# Patient Record
Sex: Male | Born: 1935 | Race: White | Hispanic: No | Smoking: Former smoker
Health system: Southern US, Community
[De-identification: ages and names within clinical notes are randomized; demographics above are authoritative.]

## PROBLEM LIST (undated history)

## (undated) DIAGNOSIS — J449 Chronic obstructive pulmonary disease, unspecified: Secondary | ICD-10-CM

## (undated) DIAGNOSIS — E538 Deficiency of other specified B group vitamins: Secondary | ICD-10-CM

## (undated) DIAGNOSIS — E039 Hypothyroidism, unspecified: Secondary | ICD-10-CM

## (undated) DIAGNOSIS — N189 Chronic kidney disease, unspecified: Secondary | ICD-10-CM

## (undated) DIAGNOSIS — G309 Alzheimer's disease, unspecified: Secondary | ICD-10-CM

## (undated) DIAGNOSIS — E559 Vitamin D deficiency, unspecified: Secondary | ICD-10-CM

## (undated) DIAGNOSIS — D649 Anemia, unspecified: Secondary | ICD-10-CM

## (undated) DIAGNOSIS — F0280 Dementia in other diseases classified elsewhere without behavioral disturbance: Secondary | ICD-10-CM

---

## 2008-03-26 ENCOUNTER — Ambulatory Visit: Payer: Self-pay | Admitting: Internal Medicine

## 2008-04-01 ENCOUNTER — Ambulatory Visit: Payer: Self-pay | Admitting: Family Medicine

## 2008-06-12 ENCOUNTER — Other Ambulatory Visit: Payer: Self-pay

## 2008-06-12 ENCOUNTER — Emergency Department: Payer: Self-pay | Admitting: Unknown Physician Specialty

## 2008-06-19 ENCOUNTER — Ambulatory Visit: Payer: Self-pay | Admitting: Specialist

## 2008-07-24 ENCOUNTER — Ambulatory Visit: Payer: Self-pay | Admitting: Unknown Physician Specialty

## 2012-03-10 ENCOUNTER — Ambulatory Visit: Payer: Self-pay | Admitting: Internal Medicine

## 2012-10-10 ENCOUNTER — Ambulatory Visit: Payer: Self-pay | Admitting: Ophthalmology

## 2012-10-10 LAB — HEMOGLOBIN: HGB: 13.3 g/dL (ref 13.0–18.0)

## 2012-10-16 ENCOUNTER — Ambulatory Visit: Payer: Self-pay | Admitting: Ophthalmology

## 2012-12-25 ENCOUNTER — Ambulatory Visit: Payer: Self-pay | Admitting: Ophthalmology

## 2013-05-08 ENCOUNTER — Ambulatory Visit: Payer: Self-pay | Admitting: Ophthalmology

## 2013-05-14 ENCOUNTER — Ambulatory Visit: Payer: Self-pay | Admitting: Ophthalmology

## 2013-08-01 ENCOUNTER — Ambulatory Visit: Payer: Self-pay | Admitting: Neurology

## 2015-03-04 NOTE — Op Note (Signed)
PATIENT NAME:  Adrian Barber, Abir R MR#:  [id number] DATE OF BIRTH:  1932-10-08  DATE OF PROCEDURE:  10/16/2012  PREOPERATIVE DIAGNOSIS:  Cataract, left eye.    POSTOPERATIVE DIAGNOSIS:  Cataract, left eye.  PROCEDURE PERFORMED:  Extracapsular cataract extraction using phacoemulsification with placement of an Alcon SN6CWS, 19.0-diopter posterior chamber lens, serial [id number].  SURGEON:  Maylon PeppersSteven A. Kewana Sanon, MD  ASSISTANT:  None.  ANESTHESIA:  4% lidocaine and 0.75% Marcaine in a 50/50 mixture with 10 units/mL of Hylenex added, given as a peribulbar.  ANESTHESIOLOGIST:  Randall AnGjibertus Van Staveren, MD  COMPLICATIONS:  None.  ESTIMATED BLOOD LOSS:  Less than 1 ml.  DESCRIPTION OF PROCEDURE:  The patient was brought to the operating room and given a peribulbar block.  The patient was then prepped and draped in the usual fashion.  The vertical rectus muscles were imbricated using 5-0 silk sutures.  These sutures were then clamped to the sterile drapes as bridle sutures.  A limbal peritomy was performed extending two clock hours and hemostasis was obtained with cautery.  A partial thickness scleral groove was made at the surgical limbus and dissected anteriorly in a lamellar dissection using an Alcon crescent knife.  The anterior chamber was entered supero-temporally with a Superblade and through the lamellar dissection with a 2.6 mm keratome.  DisCoVisc was used to replace the aqueous and a continuous tear capsulorrhexis was carried out.  Hydrodissection and hydrodelineation were carried out with balanced salt and a 27 gauge canula.  The nucleus was rotated to confirm the effectiveness of the hydrodissection.  Phacoemulsification was carried out using a divide-and-conquer technique.  Total ultrasound time was 3 minutes and 32 seconds with an average power of 25.4 percent and CDE 72.52.  Irrigation/aspiration was used to remove the residual cortex.  DisCoVisc was used to inflate the capsule and  the internal incision was enlarged to 3 mm with the crescent knife.  The intraocular lens was folded and inserted into the capsular bag using the AcrySert delivery system.  Irrigation/aspiration was used to remove the residual DisCoVisc.  Miostat was injected into the anterior chamber through the paracentesis track to inflate the anterior chamber and induce miosis.  The wound was checked for leaks and none were found. The conjunctiva was closed with cautery and the bridle sutures were removed.  Two drops of 0.3% Vigamox were placed on the eye.   An eye shield was placed on the eye.  The patient was discharged to the recovery room in good condition.  ____________________________ Maylon PeppersSteven A. Chaddrick Brue, MD sad:slb D: 10/16/2012 13:07:04 ET T: 10/16/2012 13:13:27 ET JOB#: [id number]  cc: Viviann SpareSteven A. Ivet Guerrieri, MD, <Dictator> Erline LevineSTEVEN A Erwin Nishiyama MD ELECTRONICALLY SIGNED 10/23/2012 12:53

## 2015-03-07 NOTE — Op Note (Signed)
PATIENT NAME:  Adrian Barber, Amedeo R MR#:  [id number] DATE OF BIRTH:  16-Dec-1931  DATE OF PROCEDURE:  12/25/2012  PREOPERATIVE DIAGNOSIS:  Cataract, right eye.   POSTOPERATIVE DIAGNOSIS:  Cataract, right eye.  PROCEDURE PERFORMED:  Extracapsular cataract extraction using phacoemulsification with placement of an Alcon SN6CWS, 19.8-diopter posterior chamber lens, serial [id number].  SURGEON:  Maylon PeppersSteven A. Machaela Caterino, MD  ASSISTANT:  None.  ANESTHESIA:  4% lidocaine and 0.75% Marcaine in a 50/50 mixture with 10 units/mL of Hylenex added, given as a peribulbar.   ANESTHESIOLOGIST:  Dr. Dimple Caseyice  COMPLICATIONS:  None.  ESTIMATED BLOOD LOSS:  Less than 1 ml.  DESCRIPTION OF PROCEDURE:  The patient was brought to the operating room and given a peribulbar block.  The patient was then prepped and draped in the usual fashion.  The vertical rectus muscles were imbricated using 5-0 silk sutures.  These sutures were then clamped to the sterile drapes as bridle sutures.  A limbal peritomy was performed extending two clock hours and hemostasis was obtained with cautery.  A partial thickness scleral groove was made at the surgical limbus and dissected anteriorly in a lamellar dissection using an Alcon crescent knife.  The anterior chamber was entered superonasally with a Superblade and through the lamellar dissection with a 2.6 mm keratome.  DisCoVisc was used to replace the aqueous and a continuous tear capsulorrhexis was carried out.  Hydrodissection and hydrodelineation were carried out with balanced salt and a 27 gauge canula.  The nucleus was rotated to confirm the effectiveness of the hydrodissection.  Phacoemulsification was carried out using a divide-and-conquer technique.  Total ultrasound time was 2 minutes and 23 seconds with an average power of 28.2 percent and CDE 57.16.  Irrigation/aspiration was used to remove the residual cortex.  DisCoVisc was used to inflate the capsule and the internal incision  was enlarged to 3 mm with the crescent knife.  The intraocular lens was folded and inserted into the capsular bag using the AcrySert delivery system.  Irrigation/aspiration was used to remove the residual DisCoVisc.  Miostat was injected into the anterior chamber through the paracentesis track to inflate the anterior chamber and induce miosis. A tenth of a milliliter of cefuroxime was placed in the anterior chamber via the paracentesis tract. The wound was checked for leaks and none were found. The conjunctiva was closed with cautery and the bridle sutures were removed.  Two drops of 0.3% Vigamox were placed on the eye.   An eye shield was placed on the eye.  The patient was discharged to the recovery room in good condition. ___________________________ Maylon PeppersSteven A. Kayna Suppa, MD sad:sb D: 12/25/2012 13:09:31 ET T: 12/25/2012 13:21:38 ET JOB#: [id number]  cc: Viviann SpareSteven A. Deric Bocock, MD, <Dictator> Erline LevineSTEVEN A Daphyne Miguez MD ELECTRONICALLY SIGNED 01/15/2013 13:34

## 2015-03-07 NOTE — Op Note (Signed)
PATIENT NAME:  Adrian Barber, Adrian Barber MR#:  [id number] DATE OF BIRTH:  1932-09-21  DATE OF PROCEDURE:  05/14/2013  PREOPERATIVE DIAGNOSIS: Retrained fragment of cataract, right eye.   POSTOPERATIVE DIAGNOSIS: Retained fragment cataract, right eye.   PROCEDURE PERFORMED: Removal of retained fragment, right eye.   ANESTHESIA: Lidocaine 0.04% and 0.75% Marcaine in a 50-50 mixture with 10 units/mL of Hylenex added, given as a peribulbar.   ANESTHESIOLOGIST: Dr. Noralyn Pickarroll.   COMPLICATIONS: None.   ESTIMATED BLOOD LOSS: Less than 1 mL.   DESCRIPTION OF PROCEDURE: The patient was brought to the operating room and given IV sedation. He received a peribulbar block and then was prepped and draped in the usual fashion. The vertical rectus muscles were imbricated using 5-0 silk sutures and bridle sutures. The incision was cracked open using a Barraquer. This was done after a paracentesis incision had been made superonasally. A blunt crescent knife was used to dissect through the prior wound and into the anterior chamber. Eye handpiece was inserted into the anterior chamber and the loose fragment came to the tip immediately. Barraquer sweep was used to break up the piece and get it small enough to suck into the port of the eye handpiece. The eye handpiece was removed and the wound was inflated with balanced salt. The wound was felt a little unstable, so a single 10-0 nylon suture was placed across the wound. Balanced salt was placed in the anterior chamber to deepen the chamber and 1/10 of mL of cefuroxime was injected through the paracentesis track. The knot was rotated and buried superiorly. The wound was checked for leaks. None were found. The bridle sutures were removed. Two drops of Vigamox were placed in the eye. A shield was placed on the eye and the patient was discharged to the recovery room in good condition.   ____________________________ Maylon PeppersSteven A. Tamaka Sawin, MD sad:aw D: 05/14/2013 13:56:15  ET T: 05/14/2013 14:03:52 ET JOB#: [id number]  cc: Viviann SpareSteven A. Nautia Lem, MD, <Dictator> Erline LevineSTEVEN A Darilyn Storbeck MD ELECTRONICALLY SIGNED 05/21/2013 13:24

## 2017-10-18 ENCOUNTER — Other Ambulatory Visit: Payer: Self-pay | Admitting: Internal Medicine

## 2017-10-18 DIAGNOSIS — N183 Chronic kidney disease, stage 3 unspecified: Secondary | ICD-10-CM

## 2017-10-25 ENCOUNTER — Other Ambulatory Visit: Payer: Self-pay

## 2017-10-25 ENCOUNTER — Ambulatory Visit
Admission: RE | Admit: 2017-10-25 | Discharge: 2017-10-25 | Disposition: A | Discharge status: Home / Self Care | Payer: Medicare Other | Source: Ambulatory Visit | Attending: Internal Medicine | Admitting: Internal Medicine

## 2017-10-31 ENCOUNTER — Ambulatory Visit
Admission: RE | Admit: 2017-10-31 | Discharge: 2017-10-31 | Disposition: A | Discharge status: Home / Self Care | Payer: Medicare Other | Source: Ambulatory Visit | Attending: Internal Medicine | Admitting: Internal Medicine

## 2017-10-31 DIAGNOSIS — N183 Chronic kidney disease, stage 3 unspecified: Secondary | ICD-10-CM

## 2018-06-14 ENCOUNTER — Emergency Department: Payer: Medicare Other

## 2018-06-14 ENCOUNTER — Other Ambulatory Visit: Payer: Self-pay

## 2018-06-14 ENCOUNTER — Emergency Department
Admission: EM | Admit: 2018-06-14 | Discharge: 2018-06-15 | Disposition: A | Discharge status: Home / Self Care | Payer: Medicare Other | Source: Home / Self Care | Attending: Emergency Medicine | Admitting: Emergency Medicine

## 2018-06-14 DIAGNOSIS — S72001A Fracture of unspecified part of neck of right femur, initial encounter for closed fracture: Secondary | ICD-10-CM | POA: Diagnosis not present

## 2018-06-14 DIAGNOSIS — N189 Chronic kidney disease, unspecified: Secondary | ICD-10-CM | POA: Insufficient documentation

## 2018-06-14 DIAGNOSIS — W1830XA Fall on same level, unspecified, initial encounter: Secondary | ICD-10-CM | POA: Insufficient documentation

## 2018-06-14 DIAGNOSIS — G309 Alzheimer's disease, unspecified: Secondary | ICD-10-CM

## 2018-06-14 DIAGNOSIS — W19XXXA Unspecified fall, initial encounter: Secondary | ICD-10-CM

## 2018-06-14 DIAGNOSIS — Y939 Activity, unspecified: Secondary | ICD-10-CM | POA: Insufficient documentation

## 2018-06-14 DIAGNOSIS — E039 Hypothyroidism, unspecified: Secondary | ICD-10-CM | POA: Insufficient documentation

## 2018-06-14 DIAGNOSIS — F0280 Dementia in other diseases classified elsewhere without behavioral disturbance: Secondary | ICD-10-CM

## 2018-06-14 DIAGNOSIS — S93401A Sprain of unspecified ligament of right ankle, initial encounter: Secondary | ICD-10-CM | POA: Insufficient documentation

## 2018-06-14 DIAGNOSIS — Y92129 Unspecified place in nursing home as the place of occurrence of the external cause: Secondary | ICD-10-CM | POA: Insufficient documentation

## 2018-06-14 DIAGNOSIS — J449 Chronic obstructive pulmonary disease, unspecified: Secondary | ICD-10-CM | POA: Insufficient documentation

## 2018-06-14 DIAGNOSIS — S72141A Displaced intertrochanteric fracture of right femur, initial encounter for closed fracture: Secondary | ICD-10-CM | POA: Diagnosis not present

## 2018-06-14 DIAGNOSIS — Y999 Unspecified external cause status: Secondary | ICD-10-CM | POA: Insufficient documentation

## 2018-06-14 DIAGNOSIS — M79671 Pain in right foot: Secondary | ICD-10-CM

## 2018-06-14 HISTORY — DX: Dementia in other diseases classified elsewhere, unspecified severity, without behavioral disturbance, psychotic disturbance, mood disturbance, and anxiety: F02.80

## 2018-06-14 HISTORY — DX: Deficiency of other specified B group vitamins: E53.8

## 2018-06-14 HISTORY — DX: Chronic kidney disease, unspecified: N18.9

## 2018-06-14 HISTORY — DX: Alzheimer's disease, unspecified: G30.9

## 2018-06-14 HISTORY — DX: Vitamin D deficiency, unspecified: E55.9

## 2018-06-14 HISTORY — DX: Anemia, unspecified: D64.9

## 2018-06-14 HISTORY — DX: Chronic obstructive pulmonary disease, unspecified: J44.9

## 2018-06-14 HISTORY — DX: Hypothyroidism, unspecified: E03.9

## 2018-06-14 NOTE — ED Provider Notes (Addendum)
[facility] Emergency Department Provider Note  ____________________________________________   First MD Initiated Contact with Patient 06/14/18 2312     (approximate)  I have reviewed the triage vital signs and the nursing notes.   HISTORY  Chief Complaint Fall  Level 5 caveat:  history/ROS limited by chronic dementia  HPI Adrian Barber is a 84 y.o. male with medical history as listed below which notably includes dementia who presents by EMS from his nursing facility for evaluation after a fall.  He reportedly was found sitting on the floor.  It was reported to EMS as the patient having ankle pain but the patient denied having any pain upon arrival.  EMS reports that they were told that the bilateral lower extremity swelling is normal and baseline for him.  The patient is awake and alert although not oriented due to the dementia.  He denies any pain.  He denies shortness of breath.  He does not remember what happened.  He says that his ankle does not hurt.  Additional details such as the acuity, time of onset, severity, etc., are not obtainable secondary to his chronic dementia.  Past Medical History:  Diagnosis Date  . Alzheimer's dementia   . Anemia   . Chronic kidney disease   . COPD (chronic obstructive pulmonary disease) (HCC)   . Hypothyroidism   . Vitamin B12 deficiency   . Vitamin D deficiency     There are no active problems to display for this patient.   History reviewed. No pertinent surgical history.  Prior to Admission medications   Not on File    Allergies Patient has no known allergies.  History reviewed. No pertinent family history.  Social History Social History   Tobacco Use  . Smoking status: Unknown If Ever Smoked  Substance Use Topics  . Alcohol use: Not Currently  . Drug use: Not on file    Review of Systems Level 5 caveat:  history/ROS limited by acute/critical illness.  The patient denies any pain at this  time including right ankle pain, headache, shortness of breath, neck pain, chest pain, abdominal pain. ____________________________________________   PHYSICAL EXAM:  VITAL SIGNS: ED Triage Vitals  Enc Vitals Group     BP 06/14/18 2223 (!) 170/87     Pulse Rate 06/14/18 2223 74     Resp 06/14/18 2223 16     Temp 06/14/18 2223 98.2 F (36.8 C)     Temp Source 06/14/18 2223 Oral     SpO2 06/14/18 2223 97 %     Weight 06/14/18 2221 77.1 kg (170 lb)     Height 06/14/18 2221 1.753 m (5\' 9" )     Head Circumference --      Peak Flow --      Pain Score 06/14/18 2221 0     Pain Loc --      Pain Edu? --      Excl. in GC? --     Constitutional: Awake and alert, conversant and answers questions, but not able to provide any history. Eyes: Conjunctivae are normal.  Head: Atraumatic. Nose: No congestion/rhinnorhea. Mouth/Throat: Mucous membranes are moist. Neck: No stridor.  No meningeal signs.  No cervical spine tenderness to palpation. Cardiovascular: Normal rate, regular rhythm. Good peripheral circulation. Grossly normal heart sounds. Respiratory: Normal respiratory effort.  No retractions. Lungs CTAB. Gastrointestinal: Soft and nontender. No distention.  Musculoskeletal: 1+ pitting edema bilateral lower extremities at baseline.  I fully range the patient's right ankle which  was originally reported as being painful and he had no pain or reproducible tenderness to palpation.  He is able to plantarflex and dorsiflex the foot and ankle without any pain or tenderness.  However, he reports pain/tenderness with weightbearing but only on the right foot. Neurologic:  Normal speech and language. No gross focal neurologic deficits are appreciated.  Skin:  Skin is warm, dry and intact. No rash noted.   ____________________________________________   LABS (all labs ordered are listed, but only abnormal results are displayed)  Labs Reviewed - No data to  display ____________________________________________  EKG  None - EKG not ordered by ED physician ____________________________________________  RADIOLOGY I, Loleta Rose, personally viewed and evaluated these images (plain radiographs) as part of my medical decision making, as well as reviewing the written report by the radiologist.  ED MD interpretation: No abnormal findings on radiograph other than some soft tissue swelling  Official radiology report(s): Dg Ankle Complete Right  Result Date: 06/14/2018 CLINICAL DATA:  Ankle swelling possible fall EXAM: RIGHT ANKLE - COMPLETE 3+ VIEW COMPARISON:  None. FINDINGS: Diffuse soft tissue swelling. No fracture or malalignment. Vascular calcifications. Ankle mortise is symmetric IMPRESSION: Soft tissue swelling.  No acute osseous abnormality. Electronically Signed   By: Jasmine Pang M.D.   On: 06/14/2018 22:52   Dg Foot Complete Right  Result Date: 06/15/2018 CLINICAL DATA:  Possible fall. Right foot and ankle pain. Pain most notable proximal to the great toe metatarsal phalangeal joint. EXAM: RIGHT FOOT COMPLETE - 3+ VIEW COMPARISON:  Ankle radiograph 1 our prior. FINDINGS: No acute fracture. Hammertoe deformity of the second through fifth digit partially limits assessment. 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 No focal soft tissue abnormality. IMPRESSION: No fracture or dislocation of the right foot. Electronically Signed   By: Rubye Oaks M.D.   On: 06/15/2018 01:20    ____________________________________________   PROCEDURES  Critical Care performed: No   Procedure(s) performed:   Procedures   ____________________________________________   INITIAL IMPRESSION / ASSESSMENT AND PLAN / ED COURSE  As part of my medical decision making, I reviewed the following data within the electronic MEDICAL RECORD NUMBER Nursing notes reviewed and incorporated and Radiograph reviewed     The patient is in no acute distress with  no evidence of any traumatic injuries.  He has no pain or tenderness to the right ankle or any other part of his body.  He is not having any shortness of breath.  He denies headache, neck pain, chest pain, abdominal pain, and any pain in his arms or legs.  His radiograph shows no evidence of any acute injuries to the ankle.   Clinical Course as of Jun 15 152  Thu Jun 15, 2018  0141 No evidence of foot fracture.  Will discharge as per previous plan.  DG Foot Complete Right [CF]    Clinical Course User Index [CF] Loleta Rose, MD    When Butch, his ED nurse, tried to help him with ambulation, he reported pain with weightbearing on the right foot.  Again, fortunately there is no evidence of any fracture or dislocation, but I believe he most likely has sprained his right ankle.  He is in the memory care unit and thus has access to assistance and I have no indication to admit him to the hospital based on this injury.  But called and spoke with the facility and they reported that the patient is generally independent but does have difficulty dressing himself and typically  walks "like he is drunk", placing more weight on one foot than the other but occasionally alternates which foot he favors.    We will place an Ace wrap and I will provide information about weightbearing as tolerated and follow-up with orthopedics, but at this point there is no further treatment indicated and no indication for admission to the hospital.  ____________________________________________  FINAL CLINICAL IMPRESSION(S) / ED DIAGNOSES  Final diagnoses:  Fall, initial encounter  Sprain of right ankle, unspecified ligament, initial encounter  Right foot pain     MEDICATIONS GIVEN DURING THIS VISIT:  Medications - No data to display   ED Discharge Orders    None       Note:  This document was prepared using Dragon voice recognition software and may include unintentional dictation errors.     Loleta RoseForbach, Aedyn Mckeon,  MD 06/15/18 [phone]

## 2018-06-14 NOTE — ED Triage Notes (Signed)
Pt arrives from [facility] via [facility] for a possible fall. Pt was found sitting on the floor by staff. Reported to EMS R ankle pain. Denies pain upon arrival. EMS reports dementia baseline. EMS reports bialt ankle swelling is normal. VSS enroute.

## 2018-06-14 NOTE — Discharge Instructions (Addendum)
As we discussed, you do not have any broken or dislocated bones in your foot or ankle, but most likely the pain is coming from an ankle sprain.  Please read through the included information about routine injury care (RICE = rest, ice, compression, elevation), and take over-the-counter pain medicine according to label instructions.  Use over-the-counter ibuprofen and/or tolerated as approved by your primary physician.  Follow-up is recommended with the orthopedic surgeon or with your regular doctor.

## 2018-06-15 ENCOUNTER — Emergency Department: Payer: Medicare Other

## 2018-06-15 NOTE — ED Notes (Signed)
Spoke with Fleet Contrasachel ([facility] about pt's baseline per Dr York CeriseForbach. She reports that the pt is completely independent with all ADL's at their facility except getting dressed. Pt does not normally use a cane or walker for ambulation, but (per Fleet Contrasachel) pt does "walk around like he's drunk normally, and places more weight on one foot than the other" which she relates might be responsible for his pain d/t overuse.

## 2018-06-15 NOTE — ED Notes (Signed)
Unable to obtain signature d/t pt's condition with dementia.

## 2018-06-16 ENCOUNTER — Emergency Department: Payer: Medicare Other

## 2018-06-16 ENCOUNTER — Emergency Department
Admission: EM | Admit: 2018-06-16 | Discharge: 2018-06-16 | Disposition: A | Discharge status: Home / Self Care | Payer: Medicare Other | Source: Home / Self Care | Attending: Emergency Medicine | Admitting: Emergency Medicine

## 2018-06-16 ENCOUNTER — Other Ambulatory Visit: Payer: Self-pay

## 2018-06-16 DIAGNOSIS — F0280 Dementia in other diseases classified elsewhere without behavioral disturbance: Secondary | ICD-10-CM

## 2018-06-16 DIAGNOSIS — J449 Chronic obstructive pulmonary disease, unspecified: Secondary | ICD-10-CM

## 2018-06-16 DIAGNOSIS — T887XXA Unspecified adverse effect of drug or medicament, initial encounter: Secondary | ICD-10-CM

## 2018-06-16 DIAGNOSIS — Y658 Other specified misadventures during surgical and medical care: Secondary | ICD-10-CM

## 2018-06-16 DIAGNOSIS — E039 Hypothyroidism, unspecified: Secondary | ICD-10-CM

## 2018-06-16 DIAGNOSIS — Z79899 Other long term (current) drug therapy: Secondary | ICD-10-CM | POA: Insufficient documentation

## 2018-06-16 DIAGNOSIS — J189 Pneumonia, unspecified organism: Secondary | ICD-10-CM

## 2018-06-16 DIAGNOSIS — G309 Alzheimer's disease, unspecified: Secondary | ICD-10-CM | POA: Insufficient documentation

## 2018-06-16 DIAGNOSIS — N189 Chronic kidney disease, unspecified: Secondary | ICD-10-CM

## 2018-06-16 DIAGNOSIS — Z7982 Long term (current) use of aspirin: Secondary | ICD-10-CM | POA: Insufficient documentation

## 2018-06-16 DIAGNOSIS — R4182 Altered mental status, unspecified: Secondary | ICD-10-CM

## 2018-06-16 LAB — COMPREHENSIVE METABOLIC PANEL
ALK PHOS: 66 U/L (ref 38–126)
ALT: 16 U/L (ref 0–44)
AST: 35 U/L (ref 15–41)
Albumin: 3.6 g/dL (ref 3.5–5.0)
Anion gap: 10 (ref 5–15)
BILIRUBIN TOTAL: 0.8 mg/dL (ref 0.3–1.2)
BUN: 30 mg/dL — AB (ref 8–23)
CALCIUM: 8.8 mg/dL — AB (ref 8.9–10.3)
CO2: 26 mmol/L (ref 22–32)
Chloride: 105 mmol/L (ref 98–111)
Creatinine, Ser: 1.53 mg/dL — ABNORMAL HIGH (ref 0.61–1.24)
GFR calc Af Amer: 46 mL/min — ABNORMAL LOW (ref >60)
GFR calc non Af Amer: 40 mL/min — ABNORMAL LOW (ref >60)
GLUCOSE: 133 mg/dL — AB (ref 70–99)
Potassium: 4.3 mmol/L (ref 3.5–5.1)
Sodium: 141 mmol/L (ref 135–145)
Total Protein: 7.6 g/dL (ref 6.5–8.1)

## 2018-06-16 LAB — URINALYSIS, COMPLETE (UACMP) WITH MICROSCOPIC
BACTERIA UA: NONE SEEN
BILIRUBIN URINE: NEGATIVE
Glucose, UA: NEGATIVE mg/dL
KETONES UR: NEGATIVE mg/dL
LEUKOCYTES UA: NEGATIVE
NITRITE: NEGATIVE
Protein, ur: NEGATIVE mg/dL
Specific Gravity, Urine: 1.009 (ref 1.005–1.030)
pH: 5 (ref 5.0–8.0)

## 2018-06-16 LAB — CBC WITH DIFFERENTIAL/PLATELET
Basophils Absolute: 0.1 10*3/uL (ref 0–0.1)
Basophils Relative: 0 %
EOS PCT: 0 %
Eosinophils Absolute: 0 10*3/uL (ref 0–0.7)
HCT: 36.3 % — ABNORMAL LOW (ref 40.0–52.0)
Hemoglobin: 12.4 g/dL — ABNORMAL LOW (ref 13.0–18.0)
Lymphocytes Relative: 11 %
Lymphs Abs: 1.9 10*3/uL (ref 1.0–3.6)
MCH: 30.5 pg (ref 26.0–34.0)
MCHC: 34 g/dL (ref 32.0–36.0)
MCV: 89.5 fL (ref 80.0–100.0)
MONO ABS: 1.6 10*3/uL — AB (ref 0.2–1.0)
Monocytes Relative: 10 %
Neutro Abs: 13.6 10*3/uL — ABNORMAL HIGH (ref 1.4–6.5)
Neutrophils Relative %: 79 %
PLATELETS: 199 10*3/uL (ref 150–440)
RBC: 4.06 MIL/uL — ABNORMAL LOW (ref 4.40–5.90)
RDW: 14 % (ref 11.5–14.5)
WBC: 17.3 10*3/uL — ABNORMAL HIGH (ref 3.8–10.6)

## 2018-06-16 LAB — TROPONIN I

## 2018-06-16 MED ORDER — AZITHROMYCIN 250 MG PO TABS: ORAL_TABLET | ORAL | 0 refills | Status: DC

## 2018-06-16 NOTE — ED Notes (Signed)
Patient transported to CT 

## 2018-06-16 NOTE — ED Notes (Signed)
ED Provider at bedside. 

## 2018-06-16 NOTE — ED Notes (Signed)
Esignature pad not available at this time. Pt family aware of discharge instructions and verablized understanding. [facility] aware of pt and new status.

## 2018-06-16 NOTE — ED Triage Notes (Addendum)
[facility]. Per EMS, staff state patient has been sleeping all day and nonambulatory. Pt received Remeron last night for first time. Pt sitting with head down, eyes shut and does not respond verbally.  CBG 104 with EMS. VSS with EMS.

## 2018-06-16 NOTE — ED Provider Notes (Signed)
[facility] Emergency Department Provider Note ____________________________________________   First MD Initiated Contact with Patient 06/16/18 1245     (approximate)  I have reviewed the triage vital signs and the nursing notes.   HISTORY  Chief Complaint Drowsiness  Level 5 caveat: History of present illness limited due to dementia  HPI Adrian Barber is a 84 y.o. male who presents from [facility] memory care with altered mental status.  Per EMS, the patient's family, he was found to be unresponsive this morning, and not ambulating.  The patient had a recent ankle injury, and the family states that today is the first day he has not complained about it.  They state that he also seems more confused than his baseline.  The patient had a fall a few days ago resulting in the ankle injury, but did not have any other apparent injuries at that time.  He was also given Remeron yesterday for the first time.  Past Medical History:  Diagnosis Date  . Alzheimer's dementia   . Anemia   . Chronic kidney disease   . COPD (chronic obstructive pulmonary disease) (HCC)   . Hypothyroidism   . Vitamin B12 deficiency   . Vitamin D deficiency     There are no active problems to display for this patient.   History reviewed. No pertinent surgical history.  Prior to Admission medications   Medication Sig Start Date End Date Taking? Authorizing Provider  aspirin EC 81 MG tablet Take 81 mg by mouth daily.   Yes [provider]  Cholecalciferol (VITAMIN D3) 2000 units capsule Take 2,000 Units by mouth daily.   Yes [provider]  donepezil (ARICEPT) 10 MG tablet Take 10 mg by mouth daily. 05/15/18  Yes [provider]  furosemide (LASIX) 20 MG tablet Take 20 mg by mouth daily. 03/21/18  Yes [provider]  levothyroxine (SYNTHROID, LEVOTHROID) 50 MCG tablet Take 50 mcg by mouth daily. 05/15/18  Yes [provider]  memantine  (NAMENDA) 10 MG tablet Take 10 mg by mouth 2 (two) times daily. 03/21/18  Yes [provider]  mirtazapine (REMERON) 15 MG tablet Take 15 mg by mouth at bedtime. 06/15/18 06/15/19 Yes [provider]  vitamin B-12 (CYANOCOBALAMIN) 1000 MCG tablet Take 1,000 mcg by mouth daily.   Yes [provider]    Allergies Patient has no known allergies.  No family history on file.  Social History Social History   Tobacco Use  . Smoking status: Unknown If Ever Smoked  Substance Use Topics  . Alcohol use: Not Currently  . Drug use: Not on file    Review of Systems Level 5 caveat: Unable to obtain review of systems due to altered mental status    ____________________________________________   PHYSICAL EXAM:  VITAL SIGNS: ED Triage Vitals  Enc Vitals Group     BP 06/16/18 1230 (!) 132/91     Pulse Rate 06/16/18 1230 80     Resp 06/16/18 1233 18     Temp 06/16/18 1233 98.3 F (36.8 C)     Temp Source 06/16/18 1233 Oral     SpO2 06/16/18 1230 94 %     Weight 06/16/18 1233 170 lb (77.1 kg)     Height 06/16/18 1233 5\' 9"  (1.753 m)     Head Circumference --      Peak Flow --      Pain Score --      Pain Loc --  Pain Edu? --      Excl. in GC? --     Constitutional: Initially sleeping, but fully arousable to loud voice, and now alert.  Confused. Eyes: Conjunctivae are normal.  EOMI.  PERRLA. Head: Atraumatic. Nose: No congestion/rhinnorhea. Mouth/Throat: Mucous membranes are slightly dry.   Neck: Normal range of motion.  Cardiovascular: Normal rate, regular rhythm. Grossly normal heart sounds.  Good peripheral circulation. Respiratory: Normal respiratory effort.  No retractions. Lungs CTAB. Gastrointestinal: Soft and nontender. No distention.  Genitourinary: No flank tenderness. Musculoskeletal: Extremities warm and well perfused.  Neurologic: Normal speech.  Motor intact in all extremities.  Follows commands, but unable to answer most  questions. Skin:  Skin is warm and dry. No rash noted. Psychiatric: Calm and cooperative with exam. ____________________________________________   LABS (all labs ordered are listed, but only abnormal results are displayed)  Labs Reviewed  COMPREHENSIVE METABOLIC PANEL - Abnormal; Notable for the following components:      Result Value   Glucose, Bld 133 (*)    BUN 30 (*)    Creatinine, Ser 1.53 (*)    Calcium 8.8 (*)    GFR calc non Af Amer 40 (*)    GFR calc Af Amer 46 (*)    All other components within normal limits  CBC WITH DIFFERENTIAL/PLATELET - Abnormal; Notable for the following components:   WBC 17.3 (*)    RBC 4.06 (*)    Hemoglobin 12.4 (*)    HCT 36.3 (*)    Neutro Abs 13.6 (*)    Monocytes Absolute 1.6 (*)    All other components within normal limits  URINALYSIS, COMPLETE (UACMP) WITH MICROSCOPIC - Abnormal; Notable for the following components:   Color, Urine STRAW (*)    APPearance CLEAR (*)    Hgb urine dipstick SMALL (*)    All other components within normal limits  TROPONIN I   ____________________________________________  EKG  ED ECG REPORT I, Dionne BucySebastian Rafaela Dinius, the attending physician, personally viewed and interpreted this ECG.  Date: 06/16/2018 EKG Time: 1300 Rate: 82 Rhythm: normal sinus rhythm QRS Axis: normal Intervals: normal ST/T Wave abnormalities: Nonspecific lateral T wave flattening/inversion Narrative Interpretation: Nonspecific abnormalities  ____________________________________________  RADIOLOGY  CT head: No ICH or other acute findings CXR: Bibasilar opacity; atelectasis versus minimal infiltrate  ____________________________________________   PROCEDURES  Procedure(s) performed: No  Procedures  Critical Care performed: No ____________________________________________   INITIAL IMPRESSION / ASSESSMENT AND PLAN / ED COURSE  Pertinent labs & imaging results that were available during my care of the patient were  reviewed by me and considered in my medical decision making (see chart for details).  84 year old male with history of dementia and other PMH as noted above presents with altered mental status after being given Remeron yesterday for the first time.  On ED arrival, the patient was sleeping and sitting in the bed with his head down forward, however when I awoke him he is now fully alert and conversant.  The patient is relatively severely demented.  He is able to follow commands, but is not able to answer many questions.  The family states that he is a bit more confused than normal.  The neuro exam is nonfocal and there is no evidence of acute trauma.  Overall I suspect that this sedation is a side effect of his medication, however given his comorbidities and inability to give other history, differential also includes acute CNS cause, dehydration or other metabolic etiology, infection, or less likely cardiac etiology.  Will obtain CT head, labs, and reassess.  If the patient continues to be alert and his work-up is negative, anticipate discharge back to his facility.  ----------------------------------------- 3:25 PM on 06/16/2018 -----------------------------------------    CT head was negative.  Patient's lab work-up is unremarkable except for WBC count which is elevated.  This may be a stress response related to his recent ankle injury, however given the patient's inability to give history I cannot fully rule out an infectious source.  I obtained a UA and a chest x-ray.  UA is negative.  The chest x-ray does show bibasilar minimal opacity.  The patient's sister states he has in fact been coughing.  Although my overall suspicion for pneumonia is relatively low, given that I cannot fully ruled out clinically I will treat the patient empirically.  However, the patient's vital signs are normal, he remains alert, and is tolerating p.o.  He is stable for discharge back to his facility and can take an oral  course of antibiotics.  Return precautions given verbally to the sister and will be provided in the discharge paperwork.  ____________________________________________   FINAL CLINICAL IMPRESSION(S) / ED DIAGNOSES  Final diagnoses:  Altered mental status, unspecified altered mental status type  Medication side effect  Community acquired pneumonia, unspecified laterality      NEW MEDICATIONS STARTED DURING THIS VISIT:  New Prescriptions   No medications on file     Note:  This document was prepared using Dragon voice recognition software and may include unintentional dictation errors.    Dionne Bucy, MD 06/16/18 1527

## 2018-06-16 NOTE — Discharge Instructions (Addendum)
Adrian Barber's level sedation is likely a side effect of the Remeron.  He should be taking off of this medication, and he should follow-up with the doctor who prescribed it to determine an appropriate alternative.  His work-up revealed possible mild/early pneumonia.  He has been prescribed an antibiotic to take for the next 5 days.  He should be given antibiotic as prescribed, and finish the full course.  He should return to the emergency department for new, worsening, or persistent altered mental status, weakness, confusion, or any fevers, difficulty breathing, or any other symptoms that are concerning.

## 2018-06-17 ENCOUNTER — Inpatient Hospital Stay
Admission: EM | Admit: 2018-06-17 | Discharge: 2018-06-20 | DRG: 480 | Disposition: A | Discharge status: Skilled Nursing Facility | Payer: Medicare Other | Source: Skilled Nursing Facility | Attending: Internal Medicine | Admitting: Internal Medicine

## 2018-06-17 ENCOUNTER — Encounter: Payer: Self-pay | Admitting: Emergency Medicine

## 2018-06-17 ENCOUNTER — Emergency Department: Payer: Medicare Other

## 2018-06-17 DIAGNOSIS — S72141A Displaced intertrochanteric fracture of right femur, initial encounter for closed fracture: Principal | ICD-10-CM | POA: Diagnosis present

## 2018-06-17 DIAGNOSIS — R4702 Dysphasia: Secondary | ICD-10-CM | POA: Diagnosis present

## 2018-06-17 DIAGNOSIS — D631 Anemia in chronic kidney disease: Secondary | ICD-10-CM | POA: Diagnosis present

## 2018-06-17 DIAGNOSIS — Z66 Do not resuscitate: Secondary | ICD-10-CM | POA: Diagnosis present

## 2018-06-17 DIAGNOSIS — N179 Acute kidney failure, unspecified: Secondary | ICD-10-CM | POA: Diagnosis present

## 2018-06-17 DIAGNOSIS — N19 Unspecified kidney failure: Secondary | ICD-10-CM

## 2018-06-17 DIAGNOSIS — R339 Retention of urine, unspecified: Secondary | ICD-10-CM | POA: Diagnosis not present

## 2018-06-17 DIAGNOSIS — W06XXXA Fall from bed, initial encounter: Secondary | ICD-10-CM | POA: Diagnosis present

## 2018-06-17 DIAGNOSIS — Y92092 Bedroom in other non-institutional residence as the place of occurrence of the external cause: Secondary | ICD-10-CM

## 2018-06-17 DIAGNOSIS — E039 Hypothyroidism, unspecified: Secondary | ICD-10-CM | POA: Diagnosis present

## 2018-06-17 DIAGNOSIS — F0280 Dementia in other diseases classified elsewhere without behavioral disturbance: Secondary | ICD-10-CM | POA: Diagnosis present

## 2018-06-17 DIAGNOSIS — S72009A Fracture of unspecified part of neck of unspecified femur, initial encounter for closed fracture: Secondary | ICD-10-CM | POA: Diagnosis present

## 2018-06-17 DIAGNOSIS — J189 Pneumonia, unspecified organism: Secondary | ICD-10-CM | POA: Diagnosis present

## 2018-06-17 DIAGNOSIS — G309 Alzheimer's disease, unspecified: Secondary | ICD-10-CM | POA: Diagnosis present

## 2018-06-17 DIAGNOSIS — N183 Chronic kidney disease, stage 3 (moderate): Secondary | ICD-10-CM | POA: Diagnosis present

## 2018-06-17 DIAGNOSIS — S72001A Fracture of unspecified part of neck of right femur, initial encounter for closed fracture: Secondary | ICD-10-CM

## 2018-06-17 DIAGNOSIS — J440 Chronic obstructive pulmonary disease with acute lower respiratory infection: Secondary | ICD-10-CM | POA: Diagnosis present

## 2018-06-17 DIAGNOSIS — Z8781 Personal history of (healed) traumatic fracture: Secondary | ICD-10-CM

## 2018-06-17 LAB — PROTIME-INR
INR: 1.09
PROTHROMBIN TIME: 14 s (ref 11.4–15.2)

## 2018-06-17 LAB — CBC
HCT: 36.2 % — ABNORMAL LOW (ref 40.0–52.0)
HEMOGLOBIN: 12 g/dL — AB (ref 13.0–18.0)
MCH: 29.9 pg (ref 26.0–34.0)
MCHC: 33 g/dL (ref 32.0–36.0)
MCV: 90.7 fL (ref 80.0–100.0)
PLATELETS: 193 10*3/uL (ref 150–440)
RBC: 3.99 MIL/uL — AB (ref 4.40–5.90)
RDW: 13.7 % (ref 11.5–14.5)
WBC: 16.2 10*3/uL — ABNORMAL HIGH (ref 3.8–10.6)

## 2018-06-17 LAB — BASIC METABOLIC PANEL
ANION GAP: 10 (ref 5–15)
BUN: 28 mg/dL — AB (ref 8–23)
CHLORIDE: 103 mmol/L (ref 98–111)
CO2: 27 mmol/L (ref 22–32)
Calcium: 8.7 mg/dL — ABNORMAL LOW (ref 8.9–10.3)
Creatinine, Ser: 1.66 mg/dL — ABNORMAL HIGH (ref 0.61–1.24)
GFR calc Af Amer: 42 mL/min — ABNORMAL LOW (ref >60)
GFR calc non Af Amer: 36 mL/min — ABNORMAL LOW (ref >60)
Glucose, Bld: 121 mg/dL — ABNORMAL HIGH (ref 70–99)
POTASSIUM: 3.8 mmol/L (ref 3.5–5.1)
SODIUM: 140 mmol/L (ref 135–145)

## 2018-06-17 LAB — TSH: TSH: 8.002 u[IU]/mL — ABNORMAL HIGH (ref 0.350–4.500)

## 2018-06-17 MED ORDER — SODIUM CHLORIDE 0.9% FLUSH: 3.0000 mL | INTRAVENOUS | Status: DC | PRN

## 2018-06-17 MED ORDER — MIRTAZAPINE 15 MG PO TABS
15.0000 mg | ORAL_TABLET | Freq: Every day | ORAL | Status: DC
  Administered 2018-06-17 – 2018-06-19 (×3): 15 mg via ORAL
  Filled 2018-06-17 (×3): qty 1

## 2018-06-17 MED ORDER — VITAMIN B-12 1000 MCG PO TABS
1000.0000 ug | ORAL_TABLET | Freq: Every day | ORAL | Status: DC
  Administered 2018-06-19 – 2018-06-20 (×2): 1000 ug via ORAL
  Filled 2018-06-17 (×2): qty 1

## 2018-06-17 MED ORDER — CLINDAMYCIN PHOSPHATE 600 MG/50ML IV SOLN
600.0000 mg | INTRAVENOUS | Status: AC
  Filled 2018-06-17: qty 50

## 2018-06-17 MED ORDER — BISACODYL 10 MG RE SUPP: 10.0000 mg | Freq: Every day | RECTAL | Status: DC | PRN

## 2018-06-17 MED ORDER — GUAIFENESIN ER 600 MG PO TB12
600.0000 mg | ORAL_TABLET | Freq: Two times a day (BID) | ORAL | Status: DC
  Administered 2018-06-17 – 2018-06-20 (×5): 600 mg via ORAL
  Filled 2018-06-17 (×5): qty 1

## 2018-06-17 MED ORDER — IPRATROPIUM-ALBUTEROL 0.5-2.5 (3) MG/3ML IN SOLN: 3.0000 mL | RESPIRATORY_TRACT | Status: DC | PRN

## 2018-06-17 MED ORDER — DOCUSATE SODIUM 100 MG PO CAPS
100.0000 mg | ORAL_CAPSULE | Freq: Two times a day (BID) | ORAL | Status: DC
  Administered 2018-06-18 – 2018-06-20 (×4): 100 mg via ORAL
  Filled 2018-06-17 (×5): qty 1

## 2018-06-17 MED ORDER — SODIUM CHLORIDE 0.9 % IV SOLN: 250.0000 mL | INTRAVENOUS | Status: DC | PRN

## 2018-06-17 MED ORDER — MORPHINE SULFATE (PF) 2 MG/ML IV SOLN: 2.0000 mg | INTRAVENOUS | Status: DC | PRN

## 2018-06-17 MED ORDER — ONDANSETRON HCL 4 MG PO TABS: 4.0000 mg | ORAL_TABLET | Freq: Four times a day (QID) | ORAL | Status: DC | PRN

## 2018-06-17 MED ORDER — SODIUM CHLORIDE 0.9% FLUSH: 3.0000 mL | Freq: Two times a day (BID) | INTRAVENOUS | Status: DC

## 2018-06-17 MED ORDER — FENTANYL CITRATE (PF) 100 MCG/2ML IJ SOLN
25.0000 ug | Freq: Once | INTRAMUSCULAR | Status: AC
  Administered 2018-06-17: 25 ug via INTRAVENOUS
  Filled 2018-06-17: qty 2

## 2018-06-17 MED ORDER — MORPHINE SULFATE (PF) 2 MG/ML IV SOLN
1.0000 mg | INTRAVENOUS | Status: DC | PRN
  Administered 2018-06-17: 1 mg via INTRAVENOUS
  Filled 2018-06-17: qty 1

## 2018-06-17 MED ORDER — LEVOTHYROXINE SODIUM 50 MCG PO TABS
50.0000 ug | ORAL_TABLET | Freq: Every day | ORAL | Status: DC
  Administered 2018-06-19 – 2018-06-20 (×2): 50 ug via ORAL
  Filled 2018-06-17 (×2): qty 1

## 2018-06-17 MED ORDER — MEMANTINE HCL 5 MG PO TABS
10.0000 mg | ORAL_TABLET | Freq: Two times a day (BID) | ORAL | Status: DC
  Administered 2018-06-17 – 2018-06-20 (×6): 10 mg via ORAL
  Filled 2018-06-17 (×6): qty 2

## 2018-06-17 MED ORDER — CEFAZOLIN SODIUM-DEXTROSE 1-4 GM/50ML-% IV SOLN
1.0000 g | INTRAVENOUS | Status: AC
  Filled 2018-06-17: qty 50

## 2018-06-17 MED ORDER — ONDANSETRON HCL 4 MG/2ML IJ SOLN: 4.0000 mg | Freq: Four times a day (QID) | INTRAMUSCULAR | Status: DC | PRN

## 2018-06-17 MED ORDER — ACETAMINOPHEN 325 MG PO TABS: 650.0000 mg | ORAL_TABLET | Freq: Four times a day (QID) | ORAL | Status: DC | PRN

## 2018-06-17 MED ORDER — DONEPEZIL HCL 5 MG PO TABS
10.0000 mg | ORAL_TABLET | Freq: Every day | ORAL | Status: DC
  Administered 2018-06-17 – 2018-06-20 (×3): 10 mg via ORAL
  Filled 2018-06-17 (×4): qty 2

## 2018-06-17 MED ORDER — SODIUM CHLORIDE 0.9 % IV SOLN
1.0000 g | INTRAVENOUS | Status: DC
  Administered 2018-06-17 – 2018-06-20 (×3): 1 g via INTRAVENOUS
  Filled 2018-06-17 (×3): qty 1
  Filled 2018-06-17: qty 10

## 2018-06-17 MED ORDER — POLYETHYLENE GLYCOL 3350 17 G PO PACK: 17.0000 g | PACK | Freq: Every day | ORAL | Status: DC | PRN

## 2018-06-17 MED ORDER — SODIUM CHLORIDE 0.9 % IV SOLN
INTRAVENOUS | Status: DC
Start: 2018-06-17 — End: 2018-06-20
  Administered 2018-06-17 – 2018-06-19 (×3): via INTRAVENOUS

## 2018-06-17 MED ORDER — ACETAMINOPHEN 650 MG RE SUPP: 650.0000 mg | Freq: Four times a day (QID) | RECTAL | Status: DC | PRN

## 2018-06-17 MED ORDER — FLEET ENEMA 7-19 GM/118ML RE ENEM: 1.0000 | ENEMA | Freq: Once | RECTAL | Status: DC | PRN

## 2018-06-17 MED ORDER — VITAMIN D3 25 MCG (1000 UNIT) PO TABS
2000.0000 [IU] | ORAL_TABLET | Freq: Every day | ORAL | Status: DC
  Administered 2018-06-19 – 2018-06-20 (×2): 2000 [IU] via ORAL
  Filled 2018-06-17 (×6): qty 2

## 2018-06-17 MED ORDER — HYDROCODONE-ACETAMINOPHEN 5-325 MG PO TABS: 1.0000 | ORAL_TABLET | ORAL | Status: DC | PRN

## 2018-06-17 MED ORDER — SODIUM CHLORIDE 0.9 % IV SOLN
500.0000 mg | INTRAVENOUS | Status: DC
  Administered 2018-06-17: 500 mg via INTRAVENOUS
  Filled 2018-06-17 (×2): qty 500

## 2018-06-17 NOTE — ED Provider Notes (Signed)
[facility] Emergency Department Provider Note   ____________________________________________   First MD Initiated Contact with Patient 06/17/18 [phone]     (approximate)  I have reviewed the triage vital signs and the nursing notes.   HISTORY  Chief Complaint Fall and Hip Pain (right )  EM caveat: Patient with notable dementia, did obtain some history from his secondary point of contact whom I was able to speak with  HPI Adrian Barber is a 84 y.o. male who is seen for evaluation yesterday for drowsiness.  He was diagnosed with pneumonia.  Today at his assisted living, patient fell out of bed.  He appeared in pain was sent for evaluation  The report from family as they were concerned about his right leg causing pain.  He has been having pain and was complaining of pain in his right hip area.  Patient himself while laying still denies pain, but when touching him and going over his extremities he definitely isolates pain towards his right upper leg.  Past Medical History:  Diagnosis Date  . Alzheimer's dementia   . Anemia   . Chronic kidney disease   . COPD (chronic obstructive pulmonary disease) (HCC)   . Hypothyroidism   . Vitamin B12 deficiency   . Vitamin D deficiency     There are no active problems to display for this patient.   History reviewed. No pertinent surgical history.  Prior to Admission medications   Medication Sig Start Date End Date Taking? Authorizing Provider  acetaminophen (TYLENOL) 325 MG tablet Take 650 mg by mouth every 6 (six) hours as needed for fever.   Yes [provider]  aspirin EC 81 MG tablet Take 81 mg by mouth daily.   Yes [provider]  azithromycin (ZITHROMAX Z-PAK) 250 MG tablet Take 2 tablets (500 mg) on  Day 1,  followed by 1 tablet (250 mg) once daily on Days 2 through 5. 06/16/18 06/21/18 Yes Dionne Bucy, MD  Cholecalciferol (VITAMIN D3) 2000 units capsule Take 2,000 Units by mouth  daily.   Yes [provider]  donepezil (ARICEPT) 10 MG tablet Take 10 mg by mouth daily. 05/15/18  Yes [provider]  furosemide (LASIX) 20 MG tablet Take 20 mg by mouth daily. 03/21/18  Yes [provider]  levothyroxine (SYNTHROID, LEVOTHROID) 50 MCG tablet Take 50 mcg by mouth daily. 05/15/18  Yes [provider]  memantine (NAMENDA) 10 MG tablet Take 10 mg by mouth 2 (two) times daily. 03/21/18  Yes [provider]  mirtazapine (REMERON) 15 MG tablet Take 15 mg by mouth at bedtime. 06/15/18 06/15/19 Yes [provider]  vitamin B-12 (CYANOCOBALAMIN) 1000 MCG tablet Take 1,000 mcg by mouth daily.   Yes [provider]    Allergies Patient has no known allergies.  No family history on file.  Social History Social History   Tobacco Use  . Smoking status: Unknown If Ever Smoked  Substance Use Topics  . Alcohol use: Not Currently  . Drug use: Not on file    Review of Systems   EM caveat    ____________________________________________   PHYSICAL EXAM:  VITAL SIGNS: ED Triage Vitals  Enc Vitals Group     BP 06/17/18 0832 (!) 148/78     Pulse Rate 06/17/18 0832 78     Resp 06/17/18 0832 16     Temp 06/17/18 0832 99.2 F (37.3 C)     Temp Source 06/17/18 0832 Oral  SpO2 06/17/18 0832 98 %     Weight 06/17/18 0835 170 lb (77.1 kg)     Height 06/17/18 0835 5\' 9"  (1.753 m)     Head Circumference --      Peak Flow --      Pain Score --      Pain Loc --      Pain Edu? --      Excl. in GC? --     Constitutional: Alert, oriented to self.  Resting comfortably in the bed does not appear in pain except when he tries to move he reaches for his right hip Eyes: Conjunctivae are normal. Head: Atraumatic. Nose: No congestion/rhinnorhea. Mouth/Throat: Mucous membranes are moist. Neck: No stridor.   Cardiovascular: Normal rate, regular rhythm. Grossly normal heart sounds.  Good peripheral circulation. Respiratory:  Normal respiratory effort.  No retractions. Lungs CTAB. Gastrointestinal: Soft and nontender. No distention. Musculoskeletal:   RIGHT Right upper extremity demonstrates normal strength, good use of all muscles. No edema bruising or contusions of the right shoulder/upper arm, right elbow, right forearm / hand. Full range of motion of the right right upper extremity without pain. No evidence of trauma. Strong radial pulse. Intact median/ulnar/radial neuro-muscular exam.  LEFT Left upper extremity demonstrates normal strength, good use of all muscles. No edema bruising or contusions of the left shoulder/upper arm, left elbow, left forearm / hand. Full range of motion of the left  upper extremity without pain. No evidence of trauma. Strong radial pulse. Intact median/ulnar/radial neuro-muscular exam.  Lower Extremities  No edema. Normal DP/PT pulses bilateral with good cap refill.  Normal neuro-motor function lower extremities bilateral.  RIGHT Right lower extremity demonstrates limitation in movement due to pain at the right hip joint.  There is some slight overlying edema over the right lateral hip joint with tenderness to palpation in the region of the trochanter.  There is also some slight shortening of the extremity and is held slightly in external rotation.  Strong and palpable dorsalis pedis pulse.  An Ace wrap is removed from his right ankle, the ankle itself appears atraumatic nontender.  No tenderness along the tib-fib, knee or distal femur.  LEFT Left lower extremity demonstrates normal strength, good use of all muscles. No edema bruising or contusions of the hip,  knee, ankle. Full range of motion of the left lower extremity without pain. No pain on axial loading. No evidence of trauma.   Neurologic:  Normal speech and language. No gross focal neurologic deficits are appreciated.  Skin:  Skin is warm, dry and intact. No rash noted. Psychiatric: Mood and affect are normal. Speech and  behavior are normal.  ____________________________________________   LABS (all labs ordered are listed, but only abnormal results are displayed)  Labs Reviewed  CBC - Abnormal; Notable for the following components:      Result Value   WBC 16.2 (*)    RBC 3.99 (*)    Hemoglobin 12.0 (*)    HCT 36.2 (*)    All other components within normal limits  BASIC METABOLIC PANEL - Abnormal; Notable for the following components:   Glucose, Bld 121 (*)    BUN 28 (*)    Creatinine, Ser 1.66 (*)    Calcium 8.7 (*)    GFR calc non Af Amer 36 (*)    GFR calc Af Amer 42 (*)    All other components within normal limits   ____________________________________________  EKG  Reviewed and entered by me at 10 AM  Heart rate 80 QRS 100 QTc 420 Normal sinus rhythm, occasional PVCs.  No obvious ischemic changes ____________________________________________  RADIOLOGY  X-rays reviewed positive for right hip fracture  Dg Chest 1 View  Result Date: 06/17/2018 CLINICAL DATA:  Larey SeatFell today. Hx of COPD, CKD. Pt has dementia, poor historian but denies previous injury. EXAM: CHEST  1 VIEW COMPARISON:  Chest x-rays dated 06/16/2018 and 06/12/2008. FINDINGS: Chronic bibasilar opacities, presumably scarring/fibrosis. No new lung findings. No pleural effusion or pneumothorax seen. Heart size and mediastinal contours are within normal limits. Atherosclerotic changes noted at the aortic arch. Osseous structures about the chest are unremarkable. IMPRESSION: 1. No active disease.  No evidence of pneumonia or pulmonary edema. 2. Aortic atherosclerosis. Electronically Signed   By: Bary RichardStan  Maynard M.D.   On: 06/17/2018 09:31   Ct Head Wo Contrast  Result Date: 06/17/2018 CLINICAL DATA:  Un witnessed fall EXAM: CT HEAD WITHOUT CONTRAST TECHNIQUE: Contiguous axial images were obtained from the base of the skull through the vertex without intravenous contrast. COMPARISON:  06/16/2018 FINDINGS: Brain: Diffuse atrophic changes are  again identified. No findings to suggest acute hemorrhage, acute infarction or space-occupying mass lesion are noted. Vascular: No hyperdense vessel or unexpected calcification. Skull: Bony calvarium is intact. Sinuses/Orbits: Mucosal changes are noted within the paranasal sinuses as well as bilateral air-fluid levels consistent with acute sinusitis. Other: None. IMPRESSION: Chronic atrophic changes without acute abnormality. Air-fluid levels within the maxillary antra bilaterally consistent with acute sinusitis. Diffuse mucosal thickening is noted as well. Electronically Signed   By: Alcide CleverMark  Lukens M.D.   On: 06/17/2018 09:11   Ct Head Wo Contrast  Result Date: 06/16/2018 CLINICAL DATA:  Larey SeatFell 2 days ago, dementia, increased confusion and drowsiness EXAM: CT HEAD WITHOUT CONTRAST TECHNIQUE: Contiguous axial images were obtained from the base of the skull through the vertex without intravenous contrast. COMPARISON:  MR brain 03/10/2012 FINDINGS: Brain: Generalized atrophy. Mild diffuse dilatation of the ventricular system likely ex vacuo. No midline shift or mass effect. Small vessel chronic ischemic changes of deep cerebral white matter. No intracranial hemorrhage, mass lesion, evidence of acute infarction, or extra-axial fluid collection. Vascular: No hyperdense vessels. Atherosclerotic calcifications of internal carotid and vertebral arteries at skull base. Skull: Demineralized but intact Sinuses/Orbits: Mucosal thickening in the paranasal sinuses. Air-fluid level sphenoid sinus. Opacified and thickened frontal sinus. LEFT mastoid air cells clear. Hypoplastic RIGHT mastoid air cells with opacified RIGHT external auditory canal and middle ear cavity, little changed. Other: N/A IMPRESSION: Atrophy with small vessel chronic ischemic changes of deep cerebral white matter. No acute intracranial abnormalities. Electronically Signed   By: Ulyses SouthwardMark  Boles M.D.   On: 06/16/2018 13:26   Dg Chest Portable 1 View  Result  Date: 06/16/2018 CLINICAL DATA:  Altered, possible pneumonia EXAM: PORTABLE CHEST 1 VIEW COMPARISON:  CT 06/19/2008, radiograph 06/12/2008 FINDINGS: Streaky bibasilar opacity. No pleural effusion. Normal heart size. No pneumothorax. IMPRESSION: Streaky bibasilar atelectasis or minimal infiltrates. Electronically Signed   By: Jasmine PangKim  Fujinaga M.D.   On: 06/16/2018 14:26   Dg Hip Unilat W Or Wo Pelvis 2-3 Views Right  Result Date: 06/17/2018 CLINICAL DATA:  Larey SeatFell today. Hx of COPD, CKD. Pt has dementia, poor historian but denies previous injury. EXAM: DG HIP (WITH OR WITHOUT PELVIS) 2-3V RIGHT COMPARISON:  None. FINDINGS: Significantly displaced fracture within the intertrochanteric portion of the RIGHT femoral neck, with angulation deformity and probable impaction at the fracture site. RIGHT femoral head remains well positioned relative to the acetabulum. No additional  fracture appreciated within the adjacent osseous pelvis or about the LEFT hip. IMPRESSION: Displaced fracture within the intertrochanteric portion of the RIGHT femoral neck, with prominent (nearly 90 degree) angulation deformity at the fracture site and suspected impaction at the fracture site. Femoral head remains normally positioned relative to the acetabulum. Electronically Signed   By: Bary Richard M.D.   On: 06/17/2018 09:29    ____________________________________________   PROCEDURES  Procedure(s) performed: None  Procedures  Critical Care performed: No  ____________________________________________   INITIAL IMPRESSION / ASSESSMENT AND PLAN / ED COURSE  Pertinent labs & imaging results that were available during my care of the patient were reviewed by me and considered in my medical decision making (see chart for details).  Here for evaluation after recent diagnosis and currently under treatment for community-acquired pneumonia.  Larey Seat out of bed today, now presenting with focal right hip discomfort.  Due to his baseline  confusion and dementia which family reports, CT of the head is obtained to make sure no injury cranial hemorrhage or injury though low pretest probability atraumatic exam  X-ray the right hip demonstrates acute fracture.  Discussed with Dr. Hyacinth Meeker.  Due to the patient's care needs, acute hip fracture, community acquired pneumonia and additional care needs patient will be admitted to the hospitalist service for ongoing care.      ____________________________________________   FINAL CLINICAL IMPRESSION(S) / ED DIAGNOSES  Final diagnoses:  Closed right hip fracture, initial encounter (HCC)      NEW MEDICATIONS STARTED DURING THIS VISIT:  New Prescriptions   No medications on file     Note:  This document was prepared using Dragon voice recognition software and may include unintentional dictation errors.     Sharyn Creamer, MD 06/17/18 1004

## 2018-06-17 NOTE — H&P (Signed)
[facility] - [place] at [facility]   PATIENT NAME: Adrian Barber    MR#:  [id number]  DATE OF BIRTH:  1932/06/21  DATE OF ADMISSION:  06/17/2018  PRIMARY CARE PHYSICIAN: Mickey Farber, MD   REQUESTING/REFERRING PHYSICIAN:   CHIEF COMPLAINT:   Chief Complaint  Patient presents with  . Fall  . Hip Pain    right     HISTORY OF PRESENT ILLNESS: Bertel Venard  is a 84 y.o. male with a known history per below, was seen in the ER yesterday and sent back to facility for community-acquired pneumonia, returns today as patient was found on floor-unwitnessed fall, complaining of right hip pain, ER work-up noted for right hip fracture, creatinine 1.6, CT head noted for sinusitis, white count 16,000, chest x-ray today was negative study, patient evaluated in the emergency room with multiple family members present, patient now being admitted for acute right hip fracture, community acquired pneumonia, acute kidney injury with chronic kidney disease stage III, DNR per family wishes.  PAST MEDICAL HISTORY:   Past Medical History:  Diagnosis Date  . Alzheimer's dementia   . Anemia   . Chronic kidney disease   . COPD (chronic obstructive pulmonary disease) (HCC)   . Hypothyroidism   . Vitamin B12 deficiency   . Vitamin D deficiency     PAST SURGICAL HISTORY: History reviewed. No pertinent surgical history.  SOCIAL HISTORY:  Social History   Tobacco Use  . Smoking status: Unknown If Ever Smoked  Substance Use Topics  . Alcohol use: Not Currently    FAMILY HISTORY:  HTN  DRUG ALLERGIES: No Known Allergies  REVIEW OF SYSTEMS: Poor historian due to dementia  CONSTITUTIONAL: No fever, fatigue or weakness.  EYES: No blurred or double vision.  EARS, NOSE, AND THROAT: No tinnitus or ear pain.  RESPIRATORY: No cough, shortness of breath, wheezing or hemoptysis.  CARDIOVASCULAR: No chest pain, orthopnea, edema.  GASTROINTESTINAL: No nausea, vomiting, diarrhea or  abdominal pain.  GENITOURINARY: No dysuria, hematuria.  ENDOCRINE: No polyuria, nocturia,  HEMATOLOGY: No anemia, easy bruising or bleeding SKIN: No rash or lesion. MUSCULOSKELETAL: rt hip pain   NEUROLOGIC: No tingling, numbness, weakness.  PSYCHIATRY: No anxiety or depression.   MEDICATIONS AT HOME:  Prior to Admission medications   Medication Sig Start Date End Date Taking? Authorizing Provider  acetaminophen (TYLENOL) 325 MG tablet Take 650 mg by mouth every 6 (six) hours as needed for fever.   Yes [provider]  aspirin EC 81 MG tablet Take 81 mg by mouth daily.   Yes [provider]  azithromycin (ZITHROMAX Z-PAK) 250 MG tablet Take 2 tablets (500 mg) on  Day 1,  followed by 1 tablet (250 mg) once daily on Days 2 through 5. 06/16/18 06/21/18 Yes Dionne Bucy, MD  Cholecalciferol (VITAMIN D3) 2000 units capsule Take 2,000 Units by mouth daily.   Yes [provider]  donepezil (ARICEPT) 10 MG tablet Take 10 mg by mouth daily. 05/15/18  Yes [provider]  furosemide (LASIX) 20 MG tablet Take 20 mg by mouth daily. 03/21/18  Yes [provider]  levothyroxine (SYNTHROID, LEVOTHROID) 50 MCG tablet Take 50 mcg by mouth daily. 05/15/18  Yes [provider]  memantine (NAMENDA) 10 MG tablet Take 10 mg by mouth 2 (two) times daily. 03/21/18  Yes [provider]  mirtazapine (REMERON) 15 MG tablet Take 15 mg by mouth at bedtime. 06/15/18 06/15/19 Yes [provider]  vitamin B-12 (CYANOCOBALAMIN) 1000 MCG  tablet Take 1,000 mcg by mouth daily.   Yes [provider]      PHYSICAL EXAMINATION:   VITAL SIGNS: Blood pressure 111/67, pulse 87, temperature 99.2 F (37.3 C), temperature source Oral, resp. rate (!) 28, height 5\' 9"  (1.753 m), weight 77.1 kg (170 lb), SpO2 95 %.  GENERAL:  84 y.o.-year-old patient lying in the bed with no acute distress.  frail-appearing  EYES: Pupils equal, round, reactive to light and  accommodation. No scleral icterus. Extraocular muscles intact.  HEENT: Head atraumatic, normocephalic. Oropharynx and nasopharynx clear.  NECK:  Supple, no jugular venous distention. No thyroid enlargement, no tenderness.  LUNGS: Normal breath sounds bilaterally, no wheezing, rales,rhonchi or crepitation. No use of accessory muscles of respiration.  CARDIOVASCULAR: S1, S2 normal. No murmurs, rubs, or gallops.  ABDOMEN: Soft, nontender, nondistended. Bowel sounds present. No organomegaly or mass.  EXTREMITIES: No pedal edema, cyanosis, or clubbing.  NEUROLOGIC: Cranial nerves II through XII are intact. MAES. Gait not checked.  PSYCHIATRIC: The patient is mildly lethargic but arousable, oriented x1, confused and disoriented   SKIN: No obvious rash, lesion, or ulcer.   LABORATORY PANEL:   CBC Recent Labs  Lab 06/16/18 1256 06/17/18 0838  WBC 17.3* 16.2*  HGB 12.4* 12.0*  HCT 36.3* 36.2*  PLT 199 193  MCV 89.5 90.7  MCH 30.5 29.9  MCHC 34.0 33.0  RDW 14.0 13.7  LYMPHSABS 1.9  --   MONOABS 1.6*  --   EOSABS 0.0  --   BASOSABS 0.1  --    ------------------------------------------------------------------------------------------------------------------  Chemistries  Recent Labs  Lab 06/16/18 1256 06/17/18 0838  NA 141 140  K 4.3 3.8  CL 105 103  CO2 26 27  GLUCOSE 133* 121*  BUN 30* 28*  CREATININE 1.53* 1.66*  CALCIUM 8.8* 8.7*  AST 35  --   ALT 16  --   ALKPHOS 66  --   BILITOT 0.8  --    ------------------------------------------------------------------------------------------------------------------ estimated creatinine clearance is 33.1 mL/min (A) (by C-G formula based on SCr of 1.66 mg/dL (H)). ------------------------------------------------------------------------------------------------------------------ No results for input(s): TSH, T4TOTAL, T3FREE, THYROIDAB in the last 72 hours.  Invalid input(s): FREET3   Coagulation profile No results for input(s):  INR, PROTIME in the last 168 hours. ------------------------------------------------------------------------------------------------------------------- No results for input(s): DDIMER in the last 72 hours. -------------------------------------------------------------------------------------------------------------------  Cardiac Enzymes Recent Labs  Lab 06/16/18 1256  TROPONINI <0.03   ------------------------------------------------------------------------------------------------------------------ Invalid input(s): POCBNP  ---------------------------------------------------------------------------------------------------------------  Urinalysis    Component Value Date/Time   COLORURINE STRAW (A) 06/16/2018 1256   APPEARANCEUR CLEAR (A) 06/16/2018 1256   LABSPEC 1.009 06/16/2018 1256   PHURINE 5.0 06/16/2018 1256   GLUCOSEU NEGATIVE 06/16/2018 1256   HGBUR SMALL (A) 06/16/2018 1256   BILIRUBINUR NEGATIVE 06/16/2018 1256   KETONESUR NEGATIVE 06/16/2018 1256   PROTEINUR NEGATIVE 06/16/2018 1256   NITRITE NEGATIVE 06/16/2018 1256   LEUKOCYTESUR NEGATIVE 06/16/2018 1256     RADIOLOGY: Dg Chest 1 View  Result Date: 06/17/2018 CLINICAL DATA:  Larey SeatFell today. Hx of COPD, CKD. Pt has dementia, poor historian but denies previous injury. EXAM: CHEST  1 VIEW COMPARISON:  Chest x-rays dated 06/16/2018 and 06/12/2008. FINDINGS: Chronic bibasilar opacities, presumably scarring/fibrosis. No new lung findings. No pleural effusion or pneumothorax seen. Heart size and mediastinal contours are within normal limits. Atherosclerotic changes noted at the aortic arch. Osseous structures about the chest are unremarkable. IMPRESSION: 1. No active disease.  No evidence of pneumonia or pulmonary edema. 2. Aortic atherosclerosis. Electronically Signed  By: Bary Richard M.D.   On: 06/17/2018 09:31   Ct Head Wo Contrast  Result Date: 06/17/2018 CLINICAL DATA:  Un witnessed fall EXAM: CT HEAD WITHOUT CONTRAST  TECHNIQUE: Contiguous axial images were obtained from the base of the skull through the vertex without intravenous contrast. COMPARISON:  06/16/2018 FINDINGS: Brain: Diffuse atrophic changes are again identified. No findings to suggest acute hemorrhage, acute infarction or space-occupying mass lesion are noted. Vascular: No hyperdense vessel or unexpected calcification. Skull: Bony calvarium is intact. Sinuses/Orbits: Mucosal changes are noted within the paranasal sinuses as well as bilateral air-fluid levels consistent with acute sinusitis. Other: None. IMPRESSION: Chronic atrophic changes without acute abnormality. Air-fluid levels within the maxillary antra bilaterally consistent with acute sinusitis. Diffuse mucosal thickening is noted as well. Electronically Signed   By: Alcide Clever M.D.   On: 06/17/2018 09:11   Ct Head Wo Contrast  Result Date: 06/16/2018 CLINICAL DATA:  Larey Seat 2 days ago, dementia, increased confusion and drowsiness EXAM: CT HEAD WITHOUT CONTRAST TECHNIQUE: Contiguous axial images were obtained from the base of the skull through the vertex without intravenous contrast. COMPARISON:  MR brain 03/10/2012 FINDINGS: Brain: Generalized atrophy. Mild diffuse dilatation of the ventricular system likely ex vacuo. No midline shift or mass effect. Small vessel chronic ischemic changes of deep cerebral white matter. No intracranial hemorrhage, mass lesion, evidence of acute infarction, or extra-axial fluid collection. Vascular: No hyperdense vessels. Atherosclerotic calcifications of internal carotid and vertebral arteries at skull base. Skull: Demineralized but intact Sinuses/Orbits: Mucosal thickening in the paranasal sinuses. Air-fluid level sphenoid sinus. Opacified and thickened frontal sinus. LEFT mastoid air cells clear. Hypoplastic RIGHT mastoid air cells with opacified RIGHT external auditory canal and middle ear cavity, little changed. Other: N/A IMPRESSION: Atrophy with small vessel chronic  ischemic changes of deep cerebral white matter. No acute intracranial abnormalities. Electronically Signed   By: Ulyses Southward M.D.   On: 06/16/2018 13:26   Dg Chest Portable 1 View  Result Date: 06/16/2018 CLINICAL DATA:  Altered, possible pneumonia EXAM: PORTABLE CHEST 1 VIEW COMPARISON:  CT 06/19/2008, radiograph 06/12/2008 FINDINGS: Streaky bibasilar opacity. No pleural effusion. Normal heart size. No pneumothorax. IMPRESSION: Streaky bibasilar atelectasis or minimal infiltrates. Electronically Signed   By: Jasmine Pang M.D.   On: 06/16/2018 14:26   Dg Hip Unilat W Or Wo Pelvis 2-3 Views Right  Result Date: 06/17/2018 CLINICAL DATA:  Larey Seat today. Hx of COPD, CKD. Pt has dementia, poor historian but denies previous injury. EXAM: DG HIP (WITH OR WITHOUT PELVIS) 2-3V RIGHT COMPARISON:  None. FINDINGS: Significantly displaced fracture within the intertrochanteric portion of the RIGHT femoral neck, with angulation deformity and probable impaction at the fracture site. RIGHT femoral head remains well positioned relative to the acetabulum. No additional fracture appreciated within the adjacent osseous pelvis or about the LEFT hip. IMPRESSION: Displaced fracture within the intertrochanteric portion of the RIGHT femoral neck, with prominent (nearly 90 degree) angulation deformity at the fracture site and suspected impaction at the fracture site. Femoral head remains normally positioned relative to the acetabulum. Electronically Signed   By: Bary Richard M.D.   On: 06/17/2018 09:29    EKG: Orders placed or performed during the hospital encounter of 06/17/18  . ED EKG  . ED EKG  . EKG 12-Lead  . EKG 12-Lead    IMPRESSION AND PLAN: *Acute right hip fracture Admit to regular nursing for bed, adult pain protocol, orthopedic surgery consultation for hip fracture, aspiration/fall precautions while in house  *Acute  community-acquired pneumonia Patient was discharged home from the ER yesterday with this  diagnosis, chest x-ray today's negative Rocephin/azithromycin for 3-5-day course, supplemental oxygen weaning as tolerated, mucolytic agents  *Chronic dementia Stable Increase nursing care PRN, aspiration/fall precautions while in house, Namenda, on Remeron  *COPD without exacerbation Stable Breathing treatments as needed  *Acute kidney injury with chronic kidney disease stage III IV fluids for rehydration, avoid nephrotoxic agents, hold Lasix for now, BMP in the morning  *Chronic hypothyroidism, unspecified Stable Check TSH and continue Synthroid    All the records are reviewed and case discussed with ED provider. Management plans discussed with the patient, family and they are in agreement.  CODE STATUS:dnr Advance Directive Documentation     Most Recent Value  Type of Advance Directive  Out of facility DNR (pink MOST or yellow form)  Pre-existing out of facility DNR order (yellow form or pink MOST form)  -  "MOST" Form in Place?  -       TOTAL TIME TAKING CARE OF THIS PATIENT: 45 minutes.    Evelena Asa Jhayla Podgorski M.D on 06/17/2018   Between 7am to 6pm - Pager - [phone]  After 6pm go to [email or web address] - Social research officer, government  [facility]  Office  [phone]  CC: Primary care physician; Mickey Farber, MD   Note: This dictation was prepared with Dragon dictation along with smaller phrase technology. Any transcriptional errors that result from this process are unintentional.

## 2018-06-17 NOTE — ED Triage Notes (Signed)
Pt presents to ER from [facility], per EMS reports pt was found on the floor, unwitnessed fall, right hip pain, pt not able to move right  Leg reports pain to upper thigh. Pt alert to self.

## 2018-06-17 NOTE — Progress Notes (Addendum)
Patient agitated this afternoon. Would not keep gown on, ted hose, or SCDs. Continue to monitor and attempt to re-apply gown, teds and SCDs when he seems calmer.

## 2018-06-17 NOTE — ED Notes (Signed)
Pt to ct 

## 2018-06-17 NOTE — Consult Note (Signed)
ORTHOPAEDIC CONSULTATION  REQUESTING PHYSICIAN: Salary, Evelena Asa, MD  Chief Complaint: Right hip pain  HPI: Adrian Barber is a 84 y.o. male who complains of right hip pain after a fall out of bed Wednesday night at his memory care unit at [facility].  He was seen in the emergency room and x-rays of ankle and foot were done.  He was found to have pneumonia and placed on p.o. antibiotics.  Due to increased hip pain he was brought back to the emergency room today where exam and x-rays revealed a comminuted displaced intertrochanteric fracture of the right hip.  He has been admitted for medical stabilization and operative fixation of the hip if possible.  Risks and benefits and postop protocol been discussed with his sister and niece who are present.  He is unable to converse intelligently.  We will plan to proceed with surgery tomorrow morning if cleared by the medical service.  Past Medical History:  Diagnosis Date  . Alzheimer's dementia   . Anemia   . Chronic kidney disease   . COPD (chronic obstructive pulmonary disease) (HCC)   . Hypothyroidism   . Vitamin B12 deficiency   . Vitamin D deficiency    History reviewed. No pertinent surgical history. Social History   Socioeconomic History  . Marital status: Widowed    Spouse name: Not on file  . Number of children: Not on file  . Years of education: Not on file  . Highest education level: Not on file  Occupational History  . Not on file  Social Needs  . Financial resource strain: Not on file  . Food insecurity:    Worry: Not on file    Inability: Not on file  . Transportation needs:    Medical: Not on file    Non-medical: Not on file  Tobacco Use  . Smoking status: Former Games developer  . Smokeless tobacco: Former Neurosurgeon    Types: Chew  Substance and Sexual Activity  . Alcohol use: Not Currently  . Drug use: Not on file  . Sexual activity: Not on file  Lifestyle  . Physical activity:    Days per week: Not on file   Minutes per session: Not on file  . Stress: Not on file  Relationships  . Social connections:    Talks on phone: Not on file    Gets together: Not on file    Attends religious service: Not on file    Active member of club or organization: Not on file    Attends meetings of clubs or organizations: Not on file    Relationship status: Not on file  Other Topics Concern  . Not on file  Social History Narrative  . Not on file   History reviewed. No pertinent family history. No Known Allergies Prior to Admission medications   Medication Sig Start Date End Date Taking? Authorizing Provider  acetaminophen (TYLENOL) 325 MG tablet Take 650 mg by mouth every 6 (six) hours as needed for fever.   Yes [provider]  aspirin EC 81 MG tablet Take 81 mg by mouth daily.   Yes [provider]  azithromycin (ZITHROMAX Z-PAK) 250 MG tablet Take 2 tablets (500 mg) on  Day 1,  followed by 1 tablet (250 mg) once daily on Days 2 through 5. 06/16/18 06/21/18 Yes Dionne Bucy, MD  Cholecalciferol (VITAMIN D3) 2000 units capsule Take 2,000 Units by mouth daily.   Yes [provider]  donepezil (ARICEPT) 10 MG tablet Take  10 mg by mouth daily. 05/15/18  Yes [provider]  furosemide (LASIX) 20 MG tablet Take 20 mg by mouth daily. 03/21/18  Yes [provider]  levothyroxine (SYNTHROID, LEVOTHROID) 50 MCG tablet Take 50 mcg by mouth daily. 05/15/18  Yes [provider]  memantine (NAMENDA) 10 MG tablet Take 10 mg by mouth 2 (two) times daily. 03/21/18  Yes [provider]  mirtazapine (REMERON) 15 MG tablet Take 15 mg by mouth at bedtime. 06/15/18 06/15/19 Yes [provider]  vitamin B-12 (CYANOCOBALAMIN) 1000 MCG tablet Take 1,000 mcg by mouth daily.   Yes [provider]   Dg Chest 1 View  Result Date: 06/17/2018 CLINICAL DATA:  Larey Seat today. Hx of COPD, CKD. Pt has dementia, poor historian but denies previous injury. EXAM: CHEST  1 VIEW  COMPARISON:  Chest x-rays dated 06/16/2018 and 06/12/2008. FINDINGS: Chronic bibasilar opacities, presumably scarring/fibrosis. No new lung findings. No pleural effusion or pneumothorax seen. Heart size and mediastinal contours are within normal limits. Atherosclerotic changes noted at the aortic arch. Osseous structures about the chest are unremarkable. IMPRESSION: 1. No active disease.  No evidence of pneumonia or pulmonary edema. 2. Aortic atherosclerosis. Electronically Signed   By: Bary Richard M.D.   On: 06/17/2018 09:31   Ct Head Wo Contrast  Result Date: 06/17/2018 CLINICAL DATA:  Un witnessed fall EXAM: CT HEAD WITHOUT CONTRAST TECHNIQUE: Contiguous axial images were obtained from the base of the skull through the vertex without intravenous contrast. COMPARISON:  06/16/2018 FINDINGS: Brain: Diffuse atrophic changes are again identified. No findings to suggest acute hemorrhage, acute infarction or space-occupying mass lesion are noted. Vascular: No hyperdense vessel or unexpected calcification. Skull: Bony calvarium is intact. Sinuses/Orbits: Mucosal changes are noted within the paranasal sinuses as well as bilateral air-fluid levels consistent with acute sinusitis. Other: None. IMPRESSION: Chronic atrophic changes without acute abnormality. Air-fluid levels within the maxillary antra bilaterally consistent with acute sinusitis. Diffuse mucosal thickening is noted as well. Electronically Signed   By: Alcide Clever M.D.   On: 06/17/2018 09:11   Ct Head Wo Contrast  Result Date: 06/16/2018 CLINICAL DATA:  Larey Seat 2 days ago, dementia, increased confusion and drowsiness EXAM: CT HEAD WITHOUT CONTRAST TECHNIQUE: Contiguous axial images were obtained from the base of the skull through the vertex without intravenous contrast. COMPARISON:  MR brain 03/10/2012 FINDINGS: Brain: Generalized atrophy. Mild diffuse dilatation of the ventricular system likely ex vacuo. No midline shift or mass effect. Small vessel  chronic ischemic changes of deep cerebral white matter. No intracranial hemorrhage, mass lesion, evidence of acute infarction, or extra-axial fluid collection. Vascular: No hyperdense vessels. Atherosclerotic calcifications of internal carotid and vertebral arteries at skull base. Skull: Demineralized but intact Sinuses/Orbits: Mucosal thickening in the paranasal sinuses. Air-fluid level sphenoid sinus. Opacified and thickened frontal sinus. LEFT mastoid air cells clear. Hypoplastic RIGHT mastoid air cells with opacified RIGHT external auditory canal and middle ear cavity, little changed. Other: N/A IMPRESSION: Atrophy with small vessel chronic ischemic changes of deep cerebral white matter. No acute intracranial abnormalities. Electronically Signed   By: Ulyses Southward M.D.   On: 06/16/2018 13:26   Dg Chest Portable 1 View  Result Date: 06/16/2018 CLINICAL DATA:  Altered, possible pneumonia EXAM: PORTABLE CHEST 1 VIEW COMPARISON:  CT 06/19/2008, radiograph 06/12/2008 FINDINGS: Streaky bibasilar opacity. No pleural effusion. Normal heart size. No pneumothorax. IMPRESSION: Streaky bibasilar atelectasis or minimal infiltrates. Electronically Signed   By: Jasmine Pang M.D.   On: 06/16/2018 14:26  Dg Hip Unilat W Or Wo Pelvis 2-3 Views Right  Result Date: 06/17/2018 CLINICAL DATA:  Larey SeatFell today. Hx of COPD, CKD. Pt has dementia, poor historian but denies previous injury. EXAM: DG HIP (WITH OR WITHOUT PELVIS) 2-3V RIGHT COMPARISON:  None. FINDINGS: Significantly displaced fracture within the intertrochanteric portion of the RIGHT femoral neck, with angulation deformity and probable impaction at the fracture site. RIGHT femoral head remains well positioned relative to the acetabulum. No additional fracture appreciated within the adjacent osseous pelvis or about the LEFT hip. IMPRESSION: Displaced fracture within the intertrochanteric portion of the RIGHT femoral neck, with prominent (nearly 90 degree) angulation  deformity at the fracture site and suspected impaction at the fracture site. Femoral head remains normally positioned relative to the acetabulum. Electronically Signed   By: Bary RichardStan  Maynard M.D.   On: 06/17/2018 09:29    Positive ROS: All other systems have been reviewed and were otherwise negative with the exception of those mentioned in the HPI and as above.  Physical Exam: General: Alert, no acute distress Cardiovascular: No pedal edema Respiratory: No cyanosis, no use of accessory musculature GI: No organomegaly, abdomen is soft and non-tender Skin: No lesions in the area of chief complaint Neurologic: Sensation intact distally Psychiatric: Patient is competent for consent with normal mood and affect Lymphatic: No axillary or cervical lymphadenopathy  MUSCULOSKELETAL: Right leg is shortened external rotated.  Skin is intact.  Neurovascular status is good.  There is severe pain with range of motion of the hip.  Left leg is normal.  No other orthopedic injuries are noted.  Assessment: Displaced intertrochanteric fracture right hip Community-acquired pneumonia  Plan: Open reduction internal fixation right hip tomorrow if cleared by surgery    Valinda HoarMILLER,Mischele Detter E, MD [phone]   06/17/2018 12:53 PM

## 2018-06-17 NOTE — ED Notes (Signed)
Dr. Quale at bedside.  

## 2018-06-17 NOTE — Progress Notes (Signed)
Family Meeting Note  Advance Directive:yes  Today a meeting took place with the Patient, sister, other family members.  Patient is unable to participate due ZO:XWRUEAto:Lacked capacity dementia   The following clinical team members were present during this meeting:MD  The following were discussed:Patient's diagnosis: Hip fracture, dementia COPD, chronic kidney disease, Patient's progosis: Unable to determine and Goals for treatment: DNR  Additional follow-up to be provided: prn  Time spent during discussion:20 minutes  Bertrum SolMontell D Salary, MD

## 2018-06-18 ENCOUNTER — Encounter
Admission: EM | Disposition: A | Discharge status: Skilled Nursing Facility | Payer: Self-pay | Source: Skilled Nursing Facility | Attending: Internal Medicine

## 2018-06-18 ENCOUNTER — Inpatient Hospital Stay: Payer: Medicare Other | Admitting: Anesthesiology

## 2018-06-18 ENCOUNTER — Inpatient Hospital Stay: Payer: Medicare Other

## 2018-06-18 HISTORY — PX: FEMUR IM NAIL: SHX1597

## 2018-06-18 LAB — BASIC METABOLIC PANEL
Anion gap: 7 (ref 5–15)
BUN: 31 mg/dL — ABNORMAL HIGH (ref 8–23)
CO2: 26 mmol/L (ref 22–32)
Calcium: 8 mg/dL — ABNORMAL LOW (ref 8.9–10.3)
Chloride: 106 mmol/L (ref 98–111)
Creatinine, Ser: 1.58 mg/dL — ABNORMAL HIGH (ref 0.61–1.24)
GFR calc Af Amer: 45 mL/min — ABNORMAL LOW (ref >60)
GFR calc non Af Amer: 38 mL/min — ABNORMAL LOW (ref >60)
GLUCOSE: 121 mg/dL — AB (ref 70–99)
Potassium: 3.6 mmol/L (ref 3.5–5.1)
Sodium: 139 mmol/L (ref 135–145)

## 2018-06-18 LAB — CBC
HCT: 32.1 % — ABNORMAL LOW (ref 40.0–52.0)
HEMOGLOBIN: 10.8 g/dL — AB (ref 13.0–18.0)
MCH: 30.4 pg (ref 26.0–34.0)
MCHC: 33.7 g/dL (ref 32.0–36.0)
MCV: 90.3 fL (ref 80.0–100.0)
Platelets: 192 10*3/uL (ref 150–440)
RBC: 3.55 MIL/uL — AB (ref 4.40–5.90)
RDW: 13.9 % (ref 11.5–14.5)
WBC: 12.4 10*3/uL — AB (ref 3.8–10.6)

## 2018-06-18 LAB — SURGICAL PCR SCREEN
MRSA, PCR: NEGATIVE
Staphylococcus aureus: NEGATIVE

## 2018-06-18 SURGERY — INSERTION, INTRAMEDULLARY ROD, FEMUR
Anesthesia: General | Laterality: Right

## 2018-06-18 MED ORDER — PHENOL 1.4 % MT LIQD
1.0000 | OROMUCOSAL | Status: DC | PRN
  Filled 2018-06-18: qty 177

## 2018-06-18 MED ORDER — AZITHROMYCIN 250 MG PO TABS
250.0000 mg | ORAL_TABLET | Freq: Every day | ORAL | Status: DC
  Administered 2018-06-18 – 2018-06-20 (×3): 250 mg via ORAL
  Filled 2018-06-18 (×3): qty 1

## 2018-06-18 MED ORDER — MAGNESIUM HYDROXIDE 400 MG/5ML PO SUSP
30.0000 mL | Freq: Every day | ORAL | Status: DC | PRN
  Administered 2018-06-19: 30 mL via ORAL
  Filled 2018-06-18: qty 30

## 2018-06-18 MED ORDER — NEOMYCIN-POLYMYXIN B GU 40-200000 IR SOLN
Status: AC
  Filled 2018-06-18: qty 2

## 2018-06-18 MED ORDER — PHENYLEPHRINE HCL 10 MG/ML IJ SOLN
INTRAMUSCULAR | Status: DC | PRN
  Administered 2018-06-18 (×5): 200 ug via INTRAVENOUS

## 2018-06-18 MED ORDER — KETAMINE HCL 50 MG/ML IJ SOLN
INTRAMUSCULAR | Status: AC
  Filled 2018-06-18: qty 10

## 2018-06-18 MED ORDER — PHENYLEPHRINE HCL 10 MG/ML IJ SOLN
INTRAMUSCULAR | Status: AC
  Filled 2018-06-18: qty 1

## 2018-06-18 MED ORDER — ACETAMINOPHEN 325 MG PO TABS
325.0000 mg | ORAL_TABLET | Freq: Four times a day (QID) | ORAL | Status: DC | PRN
  Administered 2018-06-19: 650 mg via ORAL
  Filled 2018-06-18: qty 2

## 2018-06-18 MED ORDER — ONDANSETRON HCL 4 MG/2ML IJ SOLN: 4.0000 mg | Freq: Four times a day (QID) | INTRAMUSCULAR | Status: DC | PRN

## 2018-06-18 MED ORDER — CLINDAMYCIN PHOSPHATE 600 MG/50ML IV SOLN
600.0000 mg | Freq: Three times a day (TID) | INTRAVENOUS | Status: AC
  Administered 2018-06-18 – 2018-06-19 (×3): 600 mg via INTRAVENOUS
  Filled 2018-06-18 (×3): qty 50

## 2018-06-18 MED ORDER — METOCLOPRAMIDE HCL 10 MG PO TABS: 5.0000 mg | ORAL_TABLET | Freq: Three times a day (TID) | ORAL | Status: DC | PRN

## 2018-06-18 MED ORDER — PROPOFOL 10 MG/ML IV BOLUS
INTRAVENOUS | Status: DC | PRN
  Administered 2018-06-18: 50 mg via INTRAVENOUS

## 2018-06-18 MED ORDER — TRAMADOL HCL 50 MG PO TABS
50.0000 mg | ORAL_TABLET | Freq: Four times a day (QID) | ORAL | Status: DC
  Administered 2018-06-19 – 2018-06-20 (×7): 50 mg via ORAL
  Filled 2018-06-18 (×7): qty 1

## 2018-06-18 MED ORDER — BUPIVACAINE-EPINEPHRINE 0.5% -1:200000 IJ SOLN
INTRAMUSCULAR | Status: DC | PRN
  Administered 2018-06-18: 30 mL

## 2018-06-18 MED ORDER — SODIUM CHLORIDE 0.45 % IV SOLN
INTRAVENOUS | Status: DC
  Administered 2018-06-18: 11:00:00 via INTRAVENOUS

## 2018-06-18 MED ORDER — BUPIVACAINE-EPINEPHRINE (PF) 0.5% -1:200000 IJ SOLN
INTRAMUSCULAR | Status: AC
Start: 2018-06-18 — End: ?
  Filled 2018-06-18: qty 30

## 2018-06-18 MED ORDER — IPRATROPIUM-ALBUTEROL 0.5-2.5 (3) MG/3ML IN SOLN
3.0000 mL | RESPIRATORY_TRACT | Status: DC
  Administered 2018-06-18 – 2018-06-19 (×6): 3 mL via RESPIRATORY_TRACT
  Filled 2018-06-18 (×6): qty 3

## 2018-06-18 MED ORDER — CEFAZOLIN SODIUM-DEXTROSE 1-4 GM/50ML-% IV SOLN
INTRAVENOUS | Status: DC | PRN
  Administered 2018-06-18: 1 g via INTRAVENOUS

## 2018-06-18 MED ORDER — HYDROCODONE-ACETAMINOPHEN 7.5-325 MG PO TABS
1.0000 | ORAL_TABLET | Freq: Four times a day (QID) | ORAL | Status: DC | PRN
  Administered 2018-06-18: 1 via ORAL
  Filled 2018-06-18: qty 1

## 2018-06-18 MED ORDER — ONDANSETRON HCL 4 MG/2ML IJ SOLN
INTRAMUSCULAR | Status: AC
  Filled 2018-06-18: qty 2

## 2018-06-18 MED ORDER — KETAMINE HCL 10 MG/ML IJ SOLN
INTRAMUSCULAR | Status: DC | PRN
  Administered 2018-06-18: 30 mg via INTRAVENOUS
  Administered 2018-06-18: 20 mg via INTRAVENOUS
  Administered 2018-06-18: 50 mg via INTRAVENOUS

## 2018-06-18 MED ORDER — ONDANSETRON HCL 4 MG/2ML IJ SOLN: 4.0000 mg | Freq: Once | INTRAMUSCULAR | Status: DC | PRN

## 2018-06-18 MED ORDER — FENTANYL CITRATE (PF) 100 MCG/2ML IJ SOLN: 25.0000 ug | INTRAMUSCULAR | Status: DC | PRN

## 2018-06-18 MED ORDER — BISACODYL 10 MG RE SUPP
10.0000 mg | Freq: Every day | RECTAL | Status: DC | PRN
  Administered 2018-06-20: 10 mg via RECTAL
  Filled 2018-06-18: qty 1

## 2018-06-18 MED ORDER — FLEET ENEMA 7-19 GM/118ML RE ENEM
1.0000 | ENEMA | Freq: Once | RECTAL | Status: AC | PRN
  Administered 2018-06-20: 1 via RECTAL

## 2018-06-18 MED ORDER — FERROUS SULFATE 325 (65 FE) MG PO TABS
325.0000 mg | ORAL_TABLET | Freq: Every day | ORAL | Status: DC
  Administered 2018-06-19 – 2018-06-20 (×2): 325 mg via ORAL
  Filled 2018-06-18 (×2): qty 1

## 2018-06-18 MED ORDER — ZOLPIDEM TARTRATE 5 MG PO TABS
5.0000 mg | ORAL_TABLET | Freq: Every evening | ORAL | Status: DC | PRN
Start: 2018-06-18 — End: 2018-06-20

## 2018-06-18 MED ORDER — CLINDAMYCIN PHOSPHATE 600 MG/50ML IV SOLN
INTRAVENOUS | Status: DC | PRN
  Administered 2018-06-18: 600 mg via INTRAVENOUS

## 2018-06-18 MED ORDER — SODIUM CHLORIDE 0.9 % IV SOLN
INTRAVENOUS | Status: DC | PRN
  Administered 2018-06-18: 50 ug/min via INTRAVENOUS

## 2018-06-18 MED ORDER — BUPIVACAINE-EPINEPHRINE (PF) 0.25% -1:200000 IJ SOLN
INTRAMUSCULAR | Status: AC
  Filled 2018-06-18: qty 30

## 2018-06-18 MED ORDER — PROPOFOL 500 MG/50ML IV EMUL
INTRAVENOUS | Status: DC | PRN
  Administered 2018-06-18: 50 ug/kg/min via INTRAVENOUS

## 2018-06-18 MED ORDER — MORPHINE SULFATE (PF) 2 MG/ML IV SOLN: 1.0000 mg | INTRAVENOUS | Status: DC | PRN

## 2018-06-18 MED ORDER — PROPOFOL 500 MG/50ML IV EMUL
INTRAVENOUS | Status: AC
  Filled 2018-06-18: qty 50

## 2018-06-18 MED ORDER — ENOXAPARIN SODIUM 40 MG/0.4ML ~~LOC~~ SOLN
40.0000 mg | SUBCUTANEOUS | Status: DC
  Administered 2018-06-19 – 2018-06-20 (×2): 40 mg via SUBCUTANEOUS
  Filled 2018-06-18 (×2): qty 0.4

## 2018-06-18 MED ORDER — CEFAZOLIN SODIUM-DEXTROSE 2-4 GM/100ML-% IV SOLN
2.0000 g | Freq: Three times a day (TID) | INTRAVENOUS | Status: AC
  Administered 2018-06-18 – 2018-06-19 (×3): 2 g via INTRAVENOUS
  Filled 2018-06-18 (×4): qty 100

## 2018-06-18 MED ORDER — FENTANYL CITRATE (PF) 100 MCG/2ML IJ SOLN
INTRAMUSCULAR | Status: AC
  Filled 2018-06-18: qty 2

## 2018-06-18 MED ORDER — HYDROCODONE-ACETAMINOPHEN 5-325 MG PO TABS: 1.0000 | ORAL_TABLET | Freq: Four times a day (QID) | ORAL | Status: DC | PRN

## 2018-06-18 MED ORDER — ALUM & MAG HYDROXIDE-SIMETH 200-200-20 MG/5ML PO SUSP: 30.0000 mL | ORAL | Status: DC | PRN

## 2018-06-18 MED ORDER — MENTHOL 3 MG MT LOZG
1.0000 | LOZENGE | OROMUCOSAL | Status: DC | PRN
  Filled 2018-06-18: qty 9

## 2018-06-18 MED ORDER — ENOXAPARIN SODIUM 30 MG/0.3ML ~~LOC~~ SOLN: 30.0000 mg | SUBCUTANEOUS | Status: DC

## 2018-06-18 MED ORDER — ONDANSETRON HCL 4 MG PO TABS: 4.0000 mg | ORAL_TABLET | Freq: Four times a day (QID) | ORAL | Status: DC | PRN

## 2018-06-18 MED ORDER — SENNA 8.6 MG PO TABS
1.0000 | ORAL_TABLET | Freq: Two times a day (BID) | ORAL | Status: DC
  Administered 2018-06-18 – 2018-06-20 (×4): 8.6 mg via ORAL
  Filled 2018-06-18 (×4): qty 1

## 2018-06-18 MED ORDER — FENTANYL CITRATE (PF) 100 MCG/2ML IJ SOLN
INTRAMUSCULAR | Status: DC | PRN
  Administered 2018-06-18 (×4): 25 ug via INTRAVENOUS

## 2018-06-18 MED ORDER — METOCLOPRAMIDE HCL 5 MG/ML IJ SOLN: 5.0000 mg | Freq: Three times a day (TID) | INTRAMUSCULAR | Status: DC | PRN

## 2018-06-18 MED ORDER — ONDANSETRON HCL 4 MG/2ML IJ SOLN
INTRAMUSCULAR | Status: DC | PRN
  Administered 2018-06-18: 4 mg via INTRAVENOUS

## 2018-06-18 SURGICAL SUPPLY — 39 items
BIT DRILL SPINE 4.0MMX260 (BIT) ×1
BIT DRILL SPINE 4.0X260 (BIT) ×1 IMPLANT
BLADE TI HELICAL 11.0 115 (Orthopedic Implant) ×1 IMPLANT
BLADE TI HELICAL 11.0MM 115MM (Orthopedic Implant) ×1 IMPLANT
BNDG COHESIVE 4X5 TAN STRL (GAUZE/BANDAGES/DRESSINGS) ×3 IMPLANT
CANISTER SUCT 1200ML W/VALVE (MISCELLANEOUS) ×3 IMPLANT
CHLORAPREP W/TINT 26ML (MISCELLANEOUS) ×4 IMPLANT
DRAPE C-ARMOR (DRAPES) IMPLANT
DRAPE INCISE 23X17 IOBAN STRL (DRAPES)
DRAPE INCISE 23X17 STRL (DRAPES) ×1 IMPLANT
DRAPE INCISE IOBAN 23X17 STRL (DRAPES) IMPLANT
DRSG AQUACEL AG ADV 3.5X10 (GAUZE/BANDAGES/DRESSINGS) IMPLANT
DRSG AQUACEL AG ADV 3.5X14 (GAUZE/BANDAGES/DRESSINGS) ×2 IMPLANT
ELECT REM PT RETURN 9FT ADLT (ELECTROSURGICAL) ×3
ELECTRODE REM PT RTRN 9FT ADLT (ELECTROSURGICAL) ×1 IMPLANT
GAUZE PETRO XEROFOAM 1X8 (MISCELLANEOUS) IMPLANT
GAUZE SPONGE 4X4 12PLY STRL (GAUZE/BANDAGES/DRESSINGS) ×3 IMPLANT
GLOVE INDICATOR 8.0 STRL GRN (GLOVE) ×3 IMPLANT
GLOVE SURG ORTHO 8.5 STRL (GLOVE) ×9 IMPLANT
GOWN STRL REUS W/ TWL LRG LVL3 (GOWN DISPOSABLE) ×1 IMPLANT
GOWN STRL REUS W/TWL LRG LVL3 (GOWN DISPOSABLE) ×2
GOWN STRL REUS W/TWL LRG LVL4 (GOWN DISPOSABLE) ×3 IMPLANT
GUIDEWIRE 3.2X400 (WIRE) ×2 IMPLANT
KIT TURNOVER KIT A (KITS) ×3 IMPLANT
MAT BLUE FLOOR 46X72 FLO (MISCELLANEOUS) ×3 IMPLANT
NAIL TROCH FX 11/130D 170-S (Nail) ×2 IMPLANT
NDL SPNL 18GX3.5 QUINCKE PK (NEEDLE) ×1 IMPLANT
NEEDLE SPNL 18GX3.5 QUINCKE PK (NEEDLE) ×3 IMPLANT
NS IRRIG 500ML POUR BTL (IV SOLUTION) ×3 IMPLANT
PACK HIP COMPR (MISCELLANEOUS) ×3 IMPLANT
REAMER ROD DEEP FLUTE 2.5X950 (INSTRUMENTS) ×3 IMPLANT
SCREW LOCKING IM 5.0MX40M (Screw) ×2 IMPLANT
STAPLER SKIN PROX 35W (STAPLE) ×3 IMPLANT
SUCTION FRAZIER HANDLE 10FR (MISCELLANEOUS) ×2
SUCTION TUBE FRAZIER 10FR DISP (MISCELLANEOUS) ×1 IMPLANT
SUT VIC AB 0 CT1 36 (SUTURE) ×3 IMPLANT
SUT VIC AB 2-0 CT1 27 (SUTURE) ×2
SUT VIC AB 2-0 CT1 TAPERPNT 27 (SUTURE) ×1 IMPLANT
SYR 30ML LL (SYRINGE) ×3 IMPLANT

## 2018-06-18 NOTE — Progress Notes (Signed)
[facility] - [place] [facility]   PATIENT NAME: Adrian Barber    MR#:  [id number]  DATE OF BIRTH:  [birth year]  SUBJECTIVE:   Patient just came back from the operating room.  He has a lot of secretions.  Patient with dementia.   REVIEW OF SYSTEMS:    unable to obtain due to dementia  Tolerating Diet: npo      DRUG ALLERGIES:  No Known Allergies  VITALS:  Blood pressure 108/81, pulse 64, temperature 98.5 F (36.9 C), temperature source Oral, resp. rate 20, height 5\' 9"  (1.753 m), weight 170 lb (77.1 kg), SpO2 97 %.  PHYSICAL EXAMINATION:  Constitutional: Appears disheveled. No distress. HENT: Normocephalic. Marland Kitchen Oropharynx is clear and moist.  Eyes: Conjunctivae and EOM are normal. PERRLA, no scleral icterus.  Neck: Normal ROM. Neck supple. No JVD. No tracheal deviation. CVS: RRR, S1/S2 +, no murmurs, no gallops, no carotid bruit.  Pulmonary: Coarse breath sounds bilaterally Abdominal: Soft. BS +,  no distension, tenderness, rebound or guarding.  Musculoskeletal: Normal range of motion. No edema and no tenderness.  Neuro: Marland Kitchen  Moves all extremities. Skin: Skin is warm and dry. No rash noted. Psychiatric: Dementia   LABORATORY PANEL:   CBC Recent Labs  Lab 06/18/18 0341  WBC 12.4*  HGB 10.8*  HCT 32.1*  PLT 192   ------------------------------------------------------------------------------------------------------------------  Chemistries  Recent Labs  Lab 06/16/18 1256  06/18/18 0341  NA 141   < > 139  K 4.3   < > 3.6  CL 105   < > 106  CO2 26   < > 26  GLUCOSE 133*   < > 121*  BUN 30*   < > 31*  CREATININE 1.53*   < > 1.58*  CALCIUM 8.8*   < > 8.0*  AST 35  --   --   ALT 16  --   --   ALKPHOS 66  --   --   BILITOT 0.8  --   --    < > = values in this interval not displayed.   ------------------------------------------------------------------------------------------------------------------  Cardiac Enzymes Recent Labs  Lab  06/16/18 1256  TROPONINI <0.03   ------------------------------------------------------------------------------------------------------------------  RADIOLOGY:  Dg Chest 1 View  Result Date: 06/17/2018 CLINICAL DATA:  Larey Seat today. Hx of COPD, CKD. Pt has dementia, poor historian but denies previous injury. EXAM: CHEST  1 VIEW COMPARISON:  Chest x-rays dated 06/16/2018 and 06/12/2008. FINDINGS: Chronic bibasilar opacities, presumably scarring/fibrosis. No new lung findings. No pleural effusion or pneumothorax seen. Heart size and mediastinal contours are within normal limits. Atherosclerotic changes noted at the aortic arch. Osseous structures about the chest are unremarkable. IMPRESSION: 1. No active disease.  No evidence of pneumonia or pulmonary edema. 2. Aortic atherosclerosis. Electronically Signed   By: Bary Richard M.D.   On: 06/17/2018 09:31   Ct Head Wo Contrast  Result Date: 06/17/2018 CLINICAL DATA:  Un witnessed fall EXAM: CT HEAD WITHOUT CONTRAST TECHNIQUE: Contiguous axial images were obtained from the base of the skull through the vertex without intravenous contrast. COMPARISON:  06/16/2018 FINDINGS: Brain: Diffuse atrophic changes are again identified. No findings to suggest acute hemorrhage, acute infarction or space-occupying mass lesion are noted. Vascular: No hyperdense vessel or unexpected calcification. Skull: Bony calvarium is intact. Sinuses/Orbits: Mucosal changes are noted within the paranasal sinuses as well as bilateral air-fluid levels consistent with acute sinusitis. Other: None. IMPRESSION: Chronic atrophic changes without acute abnormality. Air-fluid levels within the maxillary antra bilaterally consistent with  acute sinusitis. Diffuse mucosal thickening is noted as well. Electronically Signed   By: Alcide Clever M.D.   On: 06/17/2018 09:11   Ct Head Wo Contrast  Result Date: 06/16/2018 CLINICAL DATA:  Larey Seat 2 days ago, dementia, increased confusion and drowsiness EXAM: CT  HEAD WITHOUT CONTRAST TECHNIQUE: Contiguous axial images were obtained from the base of the skull through the vertex without intravenous contrast. COMPARISON:  MR brain 03/10/2012 FINDINGS: Brain: Generalized atrophy. Mild diffuse dilatation of the ventricular system likely ex vacuo. No midline shift or mass effect. Small vessel chronic ischemic changes of deep cerebral white matter. No intracranial hemorrhage, mass lesion, evidence of acute infarction, or extra-axial fluid collection. Vascular: No hyperdense vessels. Atherosclerotic calcifications of internal carotid and vertebral arteries at skull base. Skull: Demineralized but intact Sinuses/Orbits: Mucosal thickening in the paranasal sinuses. Air-fluid level sphenoid sinus. Opacified and thickened frontal sinus. LEFT mastoid air cells clear. Hypoplastic RIGHT mastoid air cells with opacified RIGHT external auditory canal and middle ear cavity, little changed. Other: N/A IMPRESSION: Atrophy with small vessel chronic ischemic changes of deep cerebral white matter. No acute intracranial abnormalities. Electronically Signed   By: Ulyses Southward M.D.   On: 06/16/2018 13:26   Dg Chest Portable 1 View  Result Date: 06/16/2018 CLINICAL DATA:  Altered, possible pneumonia EXAM: PORTABLE CHEST 1 VIEW COMPARISON:  CT 06/19/2008, radiograph 06/12/2008 FINDINGS: Streaky bibasilar opacity. No pleural effusion. Normal heart size. No pneumothorax. IMPRESSION: Streaky bibasilar atelectasis or minimal infiltrates. Electronically Signed   By: Jasmine Pang M.D.   On: 06/16/2018 14:26   Dg Hip Operative Unilat W Or W/o Pelvis Right  Result Date: 06/18/2018 CLINICAL DATA:  Right IM nail EXAM: OPERATIVE RIGHT HIP (WITH PELVIS IF PERFORMED)  VIEWS TECHNIQUE: Fluoroscopic spot image(s) were submitted for interpretation post-operatively. COMPARISON:  06/17/2018 FINDINGS: Five images are performed, showing preoperative and postoperative appearance of the RIGHT hip. Patient has  undergone intramedullary nail and lag screw fixation of intertrochanteric fracture. No dislocation or interval fracture detected. IMPRESSION: ORIF of RIGHT intertrochanteric fracture. Electronically Signed   By: Norva Pavlov M.D.   On: 06/18/2018 10:14   Dg Hip Unilat W Or Wo Pelvis 2-3 Views Right  Result Date: 06/17/2018 CLINICAL DATA:  Larey Seat today. Hx of COPD, CKD. Pt has dementia, poor historian but denies previous injury. EXAM: DG HIP (WITH OR WITHOUT PELVIS) 2-3V RIGHT COMPARISON:  None. FINDINGS: Significantly displaced fracture within the intertrochanteric portion of the RIGHT femoral neck, with angulation deformity and probable impaction at the fracture site. RIGHT femoral head remains well positioned relative to the acetabulum. No additional fracture appreciated within the adjacent osseous pelvis or about the LEFT hip. IMPRESSION: Displaced fracture within the intertrochanteric portion of the RIGHT femoral neck, with prominent (nearly 90 degree) angulation deformity at the fracture site and suspected impaction at the fracture site. Femoral head remains normally positioned relative to the acetabulum. Electronically Signed   By: Bary Richard M.D.   On: 06/17/2018 09:29     ASSESSMENT AND PLAN:   84 year old male with history of dementia and recent diagnosis of community-acquired pneumonia who presented to ER after a fall and was complaining of right hip pain.  1.Displaced fracture within the intertrochanteric portion of the RIGHT femoral neck: Postoperative day #0 ORIF DVT prophylaxis as per orthopedic surgery and pain control. CBC for a.m.   2.  Community-acquired pneumonia: Continue Rocephin and azithromycin Respiratory therapy consultation needed for thick secretions Nebulizer treatments ordered Incentive spirometer if patient able to  3.  Hypothyroidism: TSH is elevated Follow-up on T3 and T4 Continue current dose of Synthroid  4.  Dementia: Continue  Namenda        Management plans discussed with nursing  CODE STATUS: DNR  TOTAL TIME TAKING CARE OF THIS PATIENT: 38 minutes.     POSSIBLE D/C 2 days, DEPENDING ON CLINICAL CONDITION.   Shaquanna Lycan M.D on 06/18/2018 at 10:45 AM  Between 7am to 6pm - Pager - [phone] After 6pm go to [email or web address] - Social research officer, governmentpassword [email or web address]  [facility]  Office  [phone]  CC: Primary care physician; Mickey Farberhies, David, MD  Note: This dictation was prepared with Dragon dictation along with smaller phrase technology. Any transcriptional errors that result from this process are unintentional.

## 2018-06-18 NOTE — NC FL2 (Signed)
[place] MEDICAID FL2 LEVEL OF CARE SCREENING TOOL     IDENTIFICATION  Patient Name: Adrian Barber Birthdate: 30-Dec-1931 Sex: male Admission Date (Current Location): 06/17/2018  [place] and [place] Number:  Chiropodist and Address:  [facility], [address], [place], [place] [address]      Provider Number: [id number]  Attending Physician Name and Address:  Adrian Saran, MD  Relative Name and Phone Number:  Ronni Rumble (Sister) [phone]    Current Level of Care: Hospital Recommended Level of Care: Skilled Nursing Facility Prior Approval Number:    Date Approved/Denied:   PASRR Number: Pending  Discharge Plan: SNF    Current Diagnoses: Patient Active Problem List   Diagnosis Date Noted  . Hip fx (HCC) 06/17/2018    Orientation RESPIRATION BLADDER Height & Weight     Self, Place, Situation  O2(3L o2) Incontinent Weight: 170 lb (77.1 kg) Height:  5\' 9"  (175.3 cm)  BEHAVIORAL SYMPTOMS/MOOD NEUROLOGICAL BOWEL NUTRITION STATUS      Continent    AMBULATORY STATUS COMMUNICATION OF NEEDS Skin   Extensive Assist Verbally Surgical wounds                       Personal Care Assistance Level of Assistance  Bathing, Feeding, Dressing Bathing Assistance: Limited assistance Feeding assistance: Independent Dressing Assistance: Limited assistance     Functional Limitations Info  Sight, Hearing, Speech Sight Info: Adequate Hearing Info: Adequate Speech Info: Adequate    SPECIAL CARE FACTORS FREQUENCY  PT (By licensed PT)     PT Frequency: Up to 5X per week              Contractures Contractures Info: Not present    Additional Factors Info  Code Status, Allergies Code Status Info: DNR Allergies Info: no known allergies           Current Medications (06/18/2018):  This is the current hospital active medication list Current Facility-Administered Medications  Medication Dose Route Frequency Provider Last  Rate Last Dose  . 0.9 %  sodium chloride infusion   Intravenous Continuous Deeann Saint, MD 75 mL/hr at 06/17/18 1204    . [START ON 06/19/2018] acetaminophen (TYLENOL) tablet 325-650 mg  325-650 mg Oral Q6H PRN Deeann Saint, MD      . alum & mag hydroxide-simeth (MAALOX/MYLANTA) 200-200-20 MG/5ML suspension 30 mL  30 mL Oral Q4H PRN Deeann Saint, MD      . azithromycin [facility]) tablet 250 mg  250 mg Oral Daily Mody, Sital, MD      . bisacodyl (DULCOLAX) suppository 10 mg  10 mg Rectal Daily PRN Deeann Saint, MD      . ceFAZolin (ANCEF) IVPB 2g/100 mL premix  2 g Intravenous Epimenio Foot, MD      . cefTRIAXone (ROCEPHIN) 1 g in sodium chloride 0.9 % 100 mL IVPB  1 g Intravenous Q24H Deeann Saint, MD   Stopped at 06/17/18 1555  . cholecalciferol (VITAMIN D) tablet 2,000 Units  2,000 Units Oral Daily Deeann Saint, MD      . clindamycin (CLEOCIN) IVPB 600 mg  600 mg Intravenous Epimenio Foot, MD   Stopped at 06/18/18 1439  . docusate sodium (COLACE) capsule 100 mg  100 mg Oral BID Deeann Saint, MD      . donepezil (ARICEPT) tablet 10 mg  10 mg Oral Daily Deeann Saint, MD   10 mg at 06/17/18 1513  . [START ON 06/19/2018] enoxaparin (LOVENOX)  injection 40 mg  40 mg Subcutaneous Q24H Adrian SaranMody, Sital, MD      . Melene Muller[START ON 06/19/2018] ferrous sulfate tablet 325 mg  325 mg Oral Q breakfast Deeann SaintMiller, Howard, MD      . guaiFENesin [facility](MUCINEX) 12 hr tablet 600 mg  600 mg Oral BID Deeann SaintMiller, Howard, MD   600 mg at 06/17/18 2130  . HYDROcodone-acetaminophen (NORCO) 7.5-325 MG per tablet 1 tablet  1 tablet Oral Q6H PRN Deeann SaintMiller, Howard, MD      . HYDROcodone-acetaminophen (NORCO/VICODIN) 5-325 MG per tablet 1 tablet  1 tablet Oral Q6H PRN Deeann SaintMiller, Howard, MD      . ipratropium-albuterol (DUONEB) 0.5-2.5 (3) MG/3ML nebulizer solution 3 mL  3 mL Nebulization Q4H PRN Deeann SaintMiller, Howard, MD      . ipratropium-albuterol (DUONEB) 0.5-2.5 (3) MG/3ML nebulizer solution 3 mL  3 mL Nebulization Q4H Adrian SaranMody, Sital, MD   3  mL at 06/18/18 1136  . levothyroxine (SYNTHROID, LEVOTHROID) tablet 50 mcg  50 mcg Oral QAC breakfast Deeann SaintMiller, Howard, MD      . magnesium hydroxide (MILK OF MAGNESIA) suspension 30 mL  30 mL Oral Daily PRN Deeann SaintMiller, Howard, MD      . memantine [facility](NAMENDA) tablet 10 mg  10 mg Oral BID Deeann SaintMiller, Howard, MD   10 mg at 06/17/18 2130  . menthol-cetylpyridinium (CEPACOL) lozenge 3 mg  1 lozenge Oral PRN Deeann SaintMiller, Howard, MD       Or  . phenol (CHLORASEPTIC) mouth spray 1 spray  1 spray Mouth/Throat PRN Deeann SaintMiller, Howard, MD      . metoCLOPramide (REGLAN) tablet 5-10 mg  5-10 mg Oral Q8H PRN Deeann SaintMiller, Howard, MD       Or  . metoCLOPramide (REGLAN) injection 5-10 mg  5-10 mg Intravenous Q8H PRN Deeann SaintMiller, Howard, MD      . mirtazapine (REMERON) tablet 15 mg  15 mg Oral Mariel KanskyQHS Miller, Howard, MD   15 mg at 06/17/18 2130  . morphine 2 MG/ML injection 1 mg  1 mg Intravenous Q2H PRN Deeann SaintMiller, Howard, MD      . ondansetron [facility](ZOFRAN) tablet 4 mg  4 mg Oral Q6H PRN Deeann SaintMiller, Howard, MD       Or  . ondansetron [facility](ZOFRAN) injection 4 mg  4 mg Intravenous Q6H PRN Deeann SaintMiller, Howard, MD      . senna Mancel Parsons(SENOKOT) tablet 8.6 mg  1 tablet Oral BID Deeann SaintMiller, Howard, MD      . sodium phosphate (FLEET) 7-19 GM/118ML enema 1 enema  1 enema Rectal Once PRN Deeann SaintMiller, Howard, MD      . traMADol Janean Sark(ULTRAM) tablet 50 mg  50 mg Oral Q6H Deeann SaintMiller, Howard, MD      . vitamin B-12 (CYANOCOBALAMIN) tablet 1,000 mcg  1,000 mcg Oral Daily Deeann SaintMiller, Howard, MD      . zolpidem Remus Loffler(AMBIEN) tablet 5 mg  5 mg Oral QHS PRN Deeann SaintMiller, Howard, MD         Discharge Medications: Please see discharge summary for a list of discharge medications.  Relevant Imaging Results:  Relevant Lab Results:   Additional Information SS#[id number]  Judi CongKaren M Lillian Ballester, LCSW

## 2018-06-18 NOTE — Transfer of Care (Signed)
Immediate Anesthesia Transfer of Care Note  Patient: Adrian Lester KinsmanR Morandi  Procedure(s) Performed: INTRAMEDULLARY (IM) NAIL FEMORAL (Right )  Patient Location: PACU  Anesthesia Type:General  Level of Consciousness: sedated  Airway & Oxygen Therapy: Patient Spontanous Breathing and Patient connected to nasal cannula oxygen  Post-op Assessment: Report given to RN and Post -op Vital signs reviewed and stable  Post vital signs: Reviewed and stable  Last Vitals:  Vitals Value Taken Time  BP 93/57 06/18/2018  9:35 AM  Temp 36.4 C 06/18/2018  9:35 AM  Pulse 81 06/18/2018  9:38 AM  Resp 17 06/18/2018  9:38 AM  SpO2 96 % 06/18/2018  9:38 AM  Vitals shown include unvalidated device data.  Last Pain:  Vitals:   06/17/18 2317  TempSrc: Oral  PainSc:          Complications: No apparent anesthesia complications

## 2018-06-18 NOTE — Anesthesia Preprocedure Evaluation (Signed)
Anesthesia Evaluation  Patient identified by MRN, date of birth, ID band Patient confused    Reviewed: Unable to perform ROS - Chart review only  History of Anesthesia Complications Negative for: history of anesthetic complications  Airway Mallampati: II       Dental  (+) Edentulous Upper, Edentulous Lower   Pulmonary neg sleep apnea, pneumonia, unresolved, COPD,  COPD inhaler, former smoker,           Cardiovascular (-) hypertension(-) Past MI and (-) CHF (-) dysrhythmias (-) Valvular Problems/Murmurs     Neuro/Psych neg Seizures Dementia    GI/Hepatic Neg liver ROS, neg GERD  ,  Endo/Other  neg diabetesHypothyroidism   Renal/GU Renal InsufficiencyRenal disease     Musculoskeletal   Abdominal   Peds  Hematology   Anesthesia Other Findings   Reproductive/Obstetrics                             Anesthesia Physical Anesthesia Plan  ASA: III and emergent  Anesthesia Plan: General   Post-op Pain Management:    Induction: Intravenous  PONV Risk Score and Plan: 2 and Dexamethasone and Ondansetron  Airway Management Planned: Nasal Cannula  Additional Equipment:   Intra-op Plan:   Post-operative Plan:   Informed Consent: I have reviewed the patients History and Physical, chart, labs and discussed the procedure including the risks, benefits and alternatives for the proposed anesthesia with the patient or authorized representative who has indicated his/her understanding and acceptance.     Plan Discussed with:   Anesthesia Plan Comments:         Anesthesia Quick Evaluation

## 2018-06-18 NOTE — H&P (Signed)
THE PATIENT WAS SEEN PRIOR TO SURGERY TODAY.  HISTORY, ALLERGIES, HOME MEDICATIONS AND OPERATIVE PROCEDURE WERE REVIEWED. RISKS AND BENEFITS OF SURGERY DISCUSSED WITH PATIENT AGAIN.  NO CHANGES FROM INITIAL HISTORY AND PHYSICAL NOTED.    

## 2018-06-18 NOTE — Op Note (Signed)
DATE OF SURGERY:  06/18/2018  TIME: 9:32 AM  PATIENT NAME:  Adrian Barber  AGE: 84 y.o.  PRE-OPERATIVE DIAGNOSIS:  right hip fracture displaced intertrochanteric  POST-OPERATIVE DIAGNOSIS:  SAME  PROCEDURE:  INTRAMEDULLARY (IM) NAIL FEMORAL right hip fracture  SURGEON:  Corrine Tillis E  ASST:  EBL: 10 cc  COMPLICATIONS: None  OPERATIVE IMPLANTS: Synthes trochanteric femoral nail 130 degrees / 11 mm with interlocking helical blade 629 mm and distal locking screw 40 mm.  PREOPERATIVE INDICATIONS:  Adrian Barber is a 84 y.o. year old who fell and suffered a hip fracture. He was brought into the ER and then admitted and optimized and then elected for surgical intervention.    The risks benefits and alternatives were discussed with the patient including but not limited to the risks of nonoperative treatment, versus surgical intervention including infection, bleeding, nerve injury, malunion, nonunion, hardware prominence, hardware failure, need for hardware removal, blood clots, cardiopulmonary complications, morbidity, mortality, among others, and they were willing to proceed.    OPERATIVE PROCEDURE:  The patient was brought to the operating room and placed in the supine position.  General MAC anesthesia was administered, with a foley. He was placed on the fracture table.  Closed reduction was performed under C-arm guidance. The length of the femur was also measured using fluoroscopy. Time out was then performed after sterile prep and drape. He received preoperative antibiotics.  Incision was made proximal to the greater trochanter. A guidewire was placed in the appropriate position. Confirmation was made on AP and lateral views. The above-named nail was opened. I opened the proximal femur with a reamer. I then placed the nail by hand easily down. I did not need to ream the femur.  Once the nail was completely seated, I placed a guidepin into the femoral head into the center center  position through a second incision.  I measured the length, and then reamed the lateral cortex and up into the head. I then placed the helical blade. Slight compression was applied. Anatomic fixation achieved. Bone quality was mediocre.  I then secured the proximal interlock.  The distal locking screw was then placed and after confirming the position of the fracture fragments and hardware I then removed the instruments, and took final C-arm pictures AP and lateral the entire length of the leg. Anatomic reconstruction was achieved, and the wounds were irrigated copiously and closed with Vicryl  followed by staples and dry sterile dressing. Sponge and needle count were correct.   The patient was awakened and returned to PACU in stable and satisfactory condition. There no complications and the patient tolerated the procedure well.  He will be partial weightbearing as tolerated, and will be on Lovenox  For DVT prophylaxis.     Park Breed, M.D.

## 2018-06-18 NOTE — Anesthesia Post-op Follow-up Note (Signed)
Anesthesia QCDR form completed.        

## 2018-06-18 NOTE — Anesthesia Postprocedure Evaluation (Signed)
Anesthesia Post Note  Patient: Adrian Barber  Procedure(s) Performed: INTRAMEDULLARY (IM) NAIL FEMORAL (Right )  Patient location during evaluation: PACU Anesthesia Type: General Level of consciousness: awake and alert Pain management: pain level controlled Vital Signs Assessment: post-procedure vital signs reviewed and stable Respiratory status: spontaneous breathing and respiratory function stable Cardiovascular status: stable Anesthetic complications: no     Last Vitals:  Vitals:   06/18/18 1020 06/18/18 1025  BP: 110/77   Pulse: 80 70  Resp: 20 14  Temp:    SpO2: 100% 100%    Last Pain:  Vitals:   06/17/18 2317  TempSrc: Oral  PainSc:                  Bettyanne Dittman K

## 2018-06-19 ENCOUNTER — Encounter: Payer: Self-pay | Admitting: Specialist

## 2018-06-19 LAB — CBC
HEMATOCRIT: 28.3 % — AB (ref 40.0–52.0)
HEMOGLOBIN: 9.5 g/dL — AB (ref 13.0–18.0)
MCH: 30.7 pg (ref 26.0–34.0)
MCHC: 33.7 g/dL (ref 32.0–36.0)
MCV: 91 fL (ref 80.0–100.0)
Platelets: 193 10*3/uL (ref 150–440)
RBC: 3.11 MIL/uL — AB (ref 4.40–5.90)
RDW: 13.6 % (ref 11.5–14.5)
WBC: 11.7 10*3/uL — ABNORMAL HIGH (ref 3.8–10.6)

## 2018-06-19 LAB — BASIC METABOLIC PANEL
Anion gap: 8 (ref 5–15)
BUN: 30 mg/dL — AB (ref 8–23)
CHLORIDE: 108 mmol/L (ref 98–111)
CO2: 26 mmol/L (ref 22–32)
CREATININE: 1.8 mg/dL — AB (ref 0.61–1.24)
Calcium: 7.8 mg/dL — ABNORMAL LOW (ref 8.9–10.3)
GFR calc Af Amer: 38 mL/min — ABNORMAL LOW (ref >60)
GFR calc non Af Amer: 33 mL/min — ABNORMAL LOW (ref >60)
GLUCOSE: 113 mg/dL — AB (ref 70–99)
POTASSIUM: 3.8 mmol/L (ref 3.5–5.1)
Sodium: 142 mmol/L (ref 135–145)

## 2018-06-19 MED ORDER — IPRATROPIUM-ALBUTEROL 0.5-2.5 (3) MG/3ML IN SOLN
3.0000 mL | Freq: Four times a day (QID) | RESPIRATORY_TRACT | Status: DC
  Administered 2018-06-19 – 2018-06-20 (×4): 3 mL via RESPIRATORY_TRACT
  Filled 2018-06-19 (×4): qty 3

## 2018-06-19 NOTE — Progress Notes (Signed)
PT Cancellation Note  Patient Details Name: Adrian Barber MRN: [id number] DOB: 04/19/1932   Cancelled Treatment:    Reason Eval/Treat Not Completed: Pain limiting ability to participate.  Order received.  Chart reviewed.  Per RN, pt has just awakened and will need time for pain medication to take effect.  Will re-attempt again shortly.   Glenetta HewSarah Roshelle Traub, PT, DPT 06/19/2018, 9:47 AM

## 2018-06-19 NOTE — Progress Notes (Signed)
Physical Therapy Treatment Patient Details Name: Adrian Barber MRN: [id number] DOB: 1932/08/17 Today's Date: 06/19/2018    History of Present Illness Patient is an 84 year old male admitted from [facility] memory care s/p intramedullary nailing following a R displaced intertrochanteric hip fracture.  PMH includes Alzheimer's dementia, CKD, COPD and anemia.    PT Comments    Participated in exercises as described below.  Attempted bed mobility and transfer to recliner this session.  Pt resisted movement with little to no hip flexion to assist in transitioning to supine.  Pt seemed generally comfortable with only minimal grimacing noted.  Unable to transition to sitting despite verbal and tactile cues.     Follow Up Recommendations  SNF     Equipment Recommendations  None recommended by PT    Recommendations for Other Services       Precautions / Restrictions Precautions Precautions: Fall Restrictions Weight Bearing Restrictions: Yes RLE Weight Bearing: Partial weight bearing RLE Partial Weight Bearing Percentage or Pounds: 50%    Mobility  Bed Mobility Overal bed mobility: Needs Assistance Bed Mobility: Supine to Sit;Sit to Supine     Supine to sit: Total assist Sit to supine: Total assist   General bed mobility comments: Resisted movement this pm.    Transfers Overall transfer level: (Did not attempt.  Pt is unable to comprehend WB status at this time.  He also demonstrates very poor sitting balance.)               General transfer comment: deferred  Ambulation/Gait                 Stairs             Wheelchair Mobility    Modified Rankin (Stroke Patients Only)       Balance Overall balance assessment: Needs assistance Sitting-balance support: Bilateral upper extremity supported;Feet supported Sitting balance-Leahy Scale: Poor   Postural control: Posterior lean                                  Cognition  Arousal/Alertness: Awake/alert Behavior During Therapy: WFL for tasks assessed/performed Overall Cognitive Status: History of cognitive impairments - at baseline                                 General Comments: Disoriented to self and situation.  When asked what to call pt, he replied, "About the same I guess".  Follows commands inconsistently.      Exercises Other Exercises Other Exercises: BLE A/AAROM x 10 for ankle pumps, heel slides, SLR and ab/add.    General Comments        Pertinent Vitals/Pain Pain Assessment: Faces Faces Pain Scale: Hurts a little bit Pain Location: R hip during bed mobility Pain Descriptors / Indicators: Operative site guarding Pain Intervention(s): Limited activity within patient's tolerance;Monitored during session    Home Living Family/patient expects to be discharged to:: Skilled nursing facility(Patient is a poor historian and minimal history was obtained.)             Home Equipment: (When asked if patient uses a RW, he nodded.)      Prior Function            PT Goals (current goals can now be found in the care plan section) Acute Rehab PT Goals PT Goal Formulation: Patient unable  to participate in goal setting    Frequency    BID      PT Plan Current plan remains appropriate    Co-evaluation              AM-PAC PT "6 Clicks" Daily Activity  Outcome Measure  Difficulty turning over in bed (including adjusting bedclothes, sheets and blankets)?: A Lot Difficulty moving from lying on back to sitting on the side of the bed? : Unable Difficulty sitting down on and standing up from a chair with arms (e.g., wheelchair, bedside commode, etc,.)?: Unable Help needed moving to and from a bed to chair (including a wheelchair)?: Total Help needed walking in hospital room?: Total Help needed climbing 3-5 steps with a railing? : Total 6 Click Score: 7    End of Session   Activity Tolerance: Patient tolerated  treatment well Patient left: in bed;with bed alarm set;with call bell/phone within reach Nurse Communication: Mobility status PT Visit Diagnosis: Muscle weakness (generalized) (M62.81);Pain;History of falling (Z91.81) Pain - Right/Left: Right Pain - part of body: Hip     Time: 1110-1135 PT Time Calculation (min) (ACUTE ONLY): 25 min  Charges:                        Danielle DessSarah Sanna Porcaro, PTA 06/19/18, 1:11 PM

## 2018-06-19 NOTE — Clinical Social Work Placement (Addendum)
   CLINICAL SOCIAL WORK PLACEMENT  NOTE  Date:  06/19/2018  Patient Details  Name: Adrian Barber MRN: [id number] Date of Birth: 05-May-1932  Clinical Social Work is seeking post-discharge placement for this patient at the Skilled  Nursing Facility level of care (*CSW will initial, date and re-position this form in  chart as items are completed):  Yes   Patient/family provided with [facility] Clinical Social Work Department's list of facilities offering this level of care within the geographic area requested by the patient (or if unable, by the patient's family).  Yes   Patient/family informed of their freedom to choose among providers that offer the needed level of care, that participate in Medicare, Medicaid or managed care program needed by the patient, have an available bed and are willing to accept the patient.  Yes   Patient/family informed of [place]'s ownership interest in [facility] and [facility], as well as of the fact that they are under no obligation to receive care at these facilities.  PASRR submitted to EDS on 06/18/18     PASRR number received on 06/19/18     Existing PASRR number confirmed on       FL2 transmitted to all facilities in geographic area requested by pt/family on 06/18/18     FL2 transmitted to all facilities within larger geographic area on       Patient informed that his/her managed care company has contracts with or will negotiate with certain facilities, including the following:        Yes   Patient/family informed of bed offers received.  [facility]     Physician recommends and patient chooses bed at      Patient to be transferred to  [name] on  06-20-18.  Patient to be transferred to facility by  [name]     Patient family notified on  06-20-18 of transfer.  Name of family member notified:   Message left on sister [name]'s voice mail [phone].     PHYSICIAN        Additional Comment:    _______________________________________________ Sample, Darleen CrockerBailey M, LCSW 06/19/2018, 12:22 PM   Updated Ervin KnackEric R. Jocabed Cheese, MSW, Theresia MajorsLCSWA [phone]  06/20/2018 10:19 AM

## 2018-06-19 NOTE — Clinical Social Work Placement (Signed)
   CLINICAL SOCIAL WORK PLACEMENT  NOTE  Date:  06/19/2018  Patient Details  Name: Adrian Barber MRN: [id number] Date of Birth: Dec 27, 1931  Clinical Social Work is seeking post-discharge placement for this patient at the Skilled  Nursing Facility level of care (*CSW will initial, date and re-position this form in  chart as items are completed):  Yes   Patient/family provided with [place] [facility] Clinical Social Work Department's list of facilities offering this level of care within the geographic area requested by the patient (or if unable, by the patient's family).  Yes   Patient/family informed of their freedom to choose among providers that offer the needed level of care, that participate in Medicare, Medicaid or managed care program needed by the patient, have an available bed and are willing to accept the patient.  Yes   Patient/family informed of [place]'s ownership interest in [facility] and [facility], as well as of the fact that they are under no obligation to receive care at these facilities.  PASRR submitted to EDS on 06/18/18     PASRR number received on       Existing PASRR number confirmed on       FL2 transmitted to all facilities in geographic area requested by pt/family on 06/18/18     FL2 transmitted to all facilities within larger geographic area on       Patient informed that his/her managed care company has contracts with or will negotiate with certain facilities, including the following:        Yes   Patient/family informed of bed offers received.  [facility]     Physician recommends and patient chooses bed at      Patient to be transferred to   on  .  Patient to be transferred to facility by       Patient family notified on   of transfer.  Name of family member notified:        PHYSICIAN       Additional Comment:    _______________________________________________ Lavonna Lampron, Darleen CrockerBailey M, LCSW 06/19/2018, 12:09 PM

## 2018-06-19 NOTE — Evaluation (Signed)
Physical Therapy Evaluation Patient Details Name: Adrian Barber MRN: [id number] DOB: 08-05-1932 Today's Date: 06/19/2018   History of Present Illness  Patient is an 84 year old male admitted from [facility] memory care s/p intramedullary nailing following a R displaced intertrochanteric hip fracture.  PMH includes Alzheimer's dementia, CKD, COPD and anemia.  Clinical Impression  Patient is an 84 year old male who lives in [facility].  He is unable to deliver social history at this time.  Pt is in bed with safety mitts in place and R LE elevated and immobilized.  He is disoriented to self and situation but very pleasant and follows directions inconsistently.  Pt required mod A to bring LE's over EOB and to sit upright.  He demonstrated very poor sitting posture with a posterior lean and tendency to try to hold RW for support.  PT convinced pt to let go of RW and use bedside for support.  He required assistance to return to bed but was able to position and scoot up in bed with a L LE partial bridge and using bedrails.  Pt unable to converse about WB precautions at this time and not safe to stand from bedside.  Family should be present later today to obtain hx such as use of AD at baseline and ambulatory distance.  Pt will benefit from skilled PT with focus on balance and fall prevention, tolerance to activity, pain management, safe use of AD and strengthening.    Follow Up Recommendations SNF    Equipment Recommendations  None recommended by PT    Recommendations for Other Services       Precautions / Restrictions Precautions Precautions: Fall Restrictions Weight Bearing Restrictions: Yes RLE Weight Bearing: Partial weight bearing RLE Partial Weight Bearing Percentage or Pounds: 50%      Mobility  Bed Mobility Overal bed mobility: Needs Assistance Bed Mobility: Supine to Sit;Sit to Supine     Supine to sit: Mod assist Sit to supine: Mod assist   General bed  mobility comments: Hand held assist to sit at EOB and assistance in bringing LE's over EOB.  Pt was able to straighten himself in the bed and scoot up with use of bed rails and partial bridge with L LE.  Transfers Overall transfer level: (Did not attempt.  Pt is unable to comprehend WB status at this time.  He also demonstrates very poor sitting balance.)                  Ambulation/Gait                Stairs            Wheelchair Mobility    Modified Rankin (Stroke Patients Only)       Balance Overall balance assessment: Needs assistance Sitting-balance support: Bilateral upper extremity supported;Feet supported Sitting balance-Leahy Scale: Poor   Postural control: Posterior lean                                   Pertinent Vitals/Pain Pain Assessment: Faces Faces Pain Scale: Hurts a little bit Pain Location: R hip during bed mobility Pain Intervention(s): Limited activity within patient's tolerance;Premedicated before session    Home Living Family/patient expects to be discharged to:: Skilled nursing facility(Patient is a poor historian and minimal history was obtained.)               Home Equipment: (  When asked if patient uses a RW, he nodded.)      Prior Function                 Hand Dominance        Extremity/Trunk Assessment   Upper Extremity Assessment Upper Extremity Assessment: Generalized weakness    Lower Extremity Assessment Lower Extremity Assessment: Overall WFL for tasks assessed;RLE deficits/detail RLE: Unable to fully assess due to pain    Cervical / Trunk Assessment Cervical / Trunk Assessment: Kyphotic  Communication      Cognition Arousal/Alertness: Awake/alert Behavior During Therapy: WFL for tasks assessed/performed Overall Cognitive Status: History of cognitive impairments - at baseline                                 General Comments: Disoriented to self and situation.   When asked what to call pt, he replied, "About the same I guess".  Follows commands inconsistently.      General Comments      Exercises     Assessment/Plan    PT Assessment Patient needs continued PT services  PT Problem List Decreased strength;Decreased mobility;Decreased balance;Decreased knowledge of use of DME;Pain;Decreased activity tolerance;Decreased cognition       PT Treatment Interventions DME instruction;Therapeutic activities;Gait training;Therapeutic exercise;Stair training;Balance training;Functional mobility training;Neuromuscular re-education;Patient/family education;Cognitive remediation    PT Goals (Current goals can be found in the Care Plan section)  Acute Rehab PT Goals PT Goal Formulation: Patient unable to participate in goal setting    Frequency BID   Barriers to discharge        Co-evaluation               AM-PAC PT "6 Clicks" Daily Activity  Outcome Measure Difficulty turning over in bed (including adjusting bedclothes, sheets and blankets)?: A Lot Difficulty moving from lying on back to sitting on the side of the bed? : A Lot Difficulty sitting down on and standing up from a chair with arms (e.g., wheelchair, bedside commode, etc,.)?: A Lot Help needed moving to and from a bed to chair (including a wheelchair)?: A Lot Help needed walking in hospital room?: A Lot Help needed climbing 3-5 steps with a railing? : A Lot 6 Click Score: 12    End of Session   Activity Tolerance: Patient limited by pain;Patient limited by fatigue Patient left: in bed;with bed alarm set;with call bell/phone within reach Nurse Communication: Mobility status PT Visit Diagnosis: Muscle weakness (generalized) (M62.81);Pain;History of falling (Z91.81) Pain - Right/Left: Right Pain - part of body: Hip    Time: 1110-1135 PT Time Calculation (min) (ACUTE ONLY): 25 min   Charges:   PT Evaluation $PT Eval Moderate Complexity: 1 Mod          Glenetta HewSarah Neko Mcgeehan,  PT, DPT   Glenetta HewSarah Biruk Troia 06/19/2018, 11:52 AM

## 2018-06-19 NOTE — Clinical Social Work Note (Signed)
Clinical Social Work Assessment  Patient Details  Name: Adrian Barber MRN: [id number] Date of Birth: Apr 04, 1932  Date of referral:  06/19/18               Reason for consult:  Facility Placement                Permission sought to share information with:  Oceanographeracility Contact Representative Permission granted to share information::  Yes, Verbal Permission Granted  Name::      Skilled Nursing Facility   Agency::   [place]   Relationship::     Contact Information:     Housing/Transportation Living arrangements for the past 2 months:  Assisted Living Facility Source of Information:  Siblings Patient Interpreter Needed:  None Criminal Activity/Legal Involvement Pertinent to Current Situation/Hospitalization:  No - Comment as needed Significant Relationships:  Siblings Lives with:  Facility Resident Do you feel safe going back to the place where you live?  Yes Need for family participation in patient care:  Yes (Comment)  Care giving concerns:  Patient [facility] a [facility].    Social Worker assessment / plan:  Visual merchandiserClinical Social Worker (CSW) received SNF consult. PT is recommending SNF. Patient is post op day 1 from a hip fracture. CSW attempted to meet with patient however he was pleasantly confused and could not participate in assessment. CSW contacted patient's sister Adrian JonesCarolyn. Per sister patient has no children and his wife has passed away. Per Adrian Jonesarolyn she is patient's caregiver. Adrian JonesCarolyn confirmed that patient is from [facility]. CSW explained that PT is recommending SNF and that [facility] it. Sister is agreeable to SN[place] in [place]. FL2 complete and faxed out.   CSW presented bed offers to sister. She chose [facility]. Per [facility] liaison she will [facility]. CSW will continue to follow and assist as needed.   Employment status:  Disabled (Comment on whether or not currently receiving  Disability), Retired Database administratornsurance information:  Managed Medicare PT Recommendations:  Skilled Nursing Facility Information / Referral to community resources:  Skilled Nursing Facility  Patient/Family's Response to care:  Patient's sister chose [facility].   Patient/Family's Understanding of and Emotional Response to Diagnosis, Current Treatment, and Prognosis:  Patient's sister was very pleasant and thanked CSW for assistance.   Emotional Assessment Appearance:  Appears stated age Attitude/Demeanor/Rapport:  Unable to Assess Affect (typically observed):  Unable to Assess Orientation:  Oriented to Self, Fluctuating Orientation (Suspected and/or reported Sundowners) Alcohol / Substance use:  Not Applicable Psych involvement (Current and /or in the community):  No (Comment)  Discharge Needs  Concerns to be addressed:  Discharge Planning Concerns Readmission within the last 30 days:  No Current discharge risk:  Dependent with Mobility, Cognitively Impaired Barriers to Discharge:  Continued Medical Work up   [employer], Adrian CrockerBailey M, LCSW 06/19/2018, 12:11 PM

## 2018-06-19 NOTE — Progress Notes (Signed)
Subjective: 1 Day Post-Op Procedure(s) (LRB): INTRAMEDULLARY (IM) NAIL FEMORAL (Right)    Patient reports pain as mild.  Objective:   VITALS:   Vitals:   06/19/18 0951 06/19/18 1415  BP:    Pulse:  83  Resp:  16  Temp:    SpO2: 96% 99%    Neurologically intact ABD soft Neurovascular intact Sensation intact distally Intact pulses distally Dorsiflexion/Plantar flexion intact Incision: scant drainage  LABS Recent Labs    06/17/18 0838 06/18/18 0341 06/19/18 0416  HGB 12.0* 10.8* 9.5*  HCT 36.2* 32.1* 28.3*  WBC 16.2* 12.4* 11.7*  PLT 193 192 193    Recent Labs    06/17/18 0838 06/18/18 0341 06/19/18 0416  NA 140 139 142  K 3.8 3.6 3.8  BUN 28* 31* 30*  CREATININE 1.66* 1.58* 1.80*  GLUCOSE 121* 121* 113*    Recent Labs    06/17/18 1331  INR 1.09     Assessment/Plan: 1 Day Post-Op Procedure(s) (LRB): INTRAMEDULLARY (IM) NAIL FEMORAL (Right)   Advance diet Up with therapy D/C IV fluids Discharge to SNF   Discharge on enteric-coated aspirin 325 mg twice daily for 6 weeks Return to clinic 2 weeks for exam and x-ray

## 2018-06-19 NOTE — Progress Notes (Addendum)
[facility] [place] [facility]   PATIENT NAME: Adrian Barber    MR#:  [id number]  DATE OF BIRTH:  13-Jul-1932  SUBJECTIVE:   Patient sleeping this am Some agitated noted yesterday  Respiratory status is improved  REVIEW OF SYSTEMS:    unable to obtain due to dementia and sleeping Tolerating Diet: Yes      DRUG ALLERGIES:  No Known Allergies  VITALS:  Blood pressure 100/66, pulse 88, temperature 98.2 F (36.8 C), temperature source Oral, resp. rate 18, height 5\' 9"  (1.753 m), weight 170 lb (77.1 kg), SpO2 100 %.  PHYSICAL EXAMINATION:  Constitutional: Sleeping soundly. No distress. HENT: Normocephalic. .  Eyes: Conjunctivae and EOM are normal. PERRLA, no scleral icterus.  Neck: Normal ROM. Neck supple. No JVD. No tracheal deviation. CVS: RRR, S1/S2 +, no murmurs, no gallops, no carotid bruit.  Pulmonary: Clear to auscultation bilaterally without crackles rales or rhonchi  abdominal: Soft. BS +,  no distension, tenderness, rebound or guarding.  Musculoskeletal: Buck's traction in place  no edema and no tenderness.  Neuro: .  Hard to examine at this time Skin: Skin is warm and dry. No rash noted. Psychiatric: Dementia   LABORATORY PANEL:   CBC Recent Labs  Lab 06/19/18 0416  WBC 11.7*  HGB 9.5*  HCT 28.3*  PLT 193   ------------------------------------------------------------------------------------------------------------------  Chemistries  Recent Labs  Lab 06/16/18 1256  06/19/18 0416  NA 141   < > 142  K 4.3   < > 3.8  CL 105   < > 108  CO2 26   < > 26  GLUCOSE 133*   < > 113*  BUN 30*   < > 30*  CREATININE 1.53*   < > 1.80*  CALCIUM 8.8*   < > 7.8*  AST 35  --   --   ALT 16  --   --   ALKPHOS 66  --   --   BILITOT 0.8  --   --    < > = values in this interval not displayed.   ------------------------------------------------------------------------------------------------------------------  Cardiac Enzymes Recent  Labs  Lab 06/16/18 1256  TROPONINI <0.03   ------------------------------------------------------------------------------------------------------------------  RADIOLOGY:  Dg Hip Operative Unilat W Or W/o Pelvis Right  Result Date: 06/18/2018 CLINICAL DATA:  Right IM nail EXAM: OPERATIVE RIGHT HIP (WITH PELVIS IF PERFORMED)  VIEWS TECHNIQUE: Fluoroscopic spot image(s) were submitted for interpretation post-operatively. COMPARISON:  06/17/2018 FINDINGS: Five images are performed, showing preoperative and postoperative appearance of the RIGHT hip. Patient has undergone intramedullary nail and lag screw fixation of intertrochanteric fracture. No dislocation or interval fracture detected. IMPRESSION: ORIF of RIGHT intertrochanteric fracture. Electronically Signed   By: Norva Pavlov M.D.   On: 06/18/2018 10:14     ASSESSMENT AND PLAN:   84 year old male with history of dementia and recent diagnosis of community-acquired pneumonia who presented to ER after a fall and was complaining of right hip pain.  1.Displaced fracture within the intertrochanteric portion of the RIGHT femoral neck: Postoperative day #1 ORIF DVT prophylaxis as per orthopedic surgery and pain control.    2.  Community-acquired pneumonia: Continue Rocephin and azithromycin for 3 more days. ISS if patient is able to follow instructions.    3.  Hypothyroidism: TSH is elevated T3 and T4 still pending Continue current dose of Synthroid  4.  Dementia: Continue Namenda   5.  Chronic kidney disease stage III: Creatinine remains near baseline.     Management plans discussed with  nursing  CODE STATUS: DNR  TOTAL TIME TAKING CARE OF THIS PATIENT: 21 minutes.     POSSIBLE D/C tomorrow to skilled nursing facility DEPENDING ON CLINICAL CONDITION.   Isrrael Fluckiger M.D on 06/19/2018 at 9:38 AM  Between 7am to 6pm - Pager - [phone] After 6pm go to [email or web address] - Social research officer, [email or web address] [email or web address]  [facility] [place] [facility]   Office  [phone]  CC: Primary care physician; Mickey Farberhies, David, MD  Note: This dictation was prepared with Dragon dictation along with smaller phrase technology. Any transcriptional errors that result from this process are unintentional.

## 2018-06-20 ENCOUNTER — Inpatient Hospital Stay: Payer: Medicare Other

## 2018-06-20 LAB — CBC
HCT: 26.2 % — ABNORMAL LOW (ref 40.0–52.0)
Hemoglobin: 8.9 g/dL — ABNORMAL LOW (ref 13.0–18.0)
MCH: 30.7 pg (ref 26.0–34.0)
MCHC: 34.1 g/dL (ref 32.0–36.0)
MCV: 90 fL (ref 80.0–100.0)
PLATELETS: 209 10*3/uL (ref 150–440)
RBC: 2.91 MIL/uL — AB (ref 4.40–5.90)
RDW: 13.7 % (ref 11.5–14.5)
WBC: 11.4 10*3/uL — ABNORMAL HIGH (ref 3.8–10.6)

## 2018-06-20 LAB — BASIC METABOLIC PANEL
Anion gap: 9 (ref 5–15)
BUN: 43 mg/dL — AB (ref 8–23)
CALCIUM: 7.7 mg/dL — AB (ref 8.9–10.3)
CO2: 22 mmol/L (ref 22–32)
CREATININE: 3.71 mg/dL — AB (ref 0.61–1.24)
Chloride: 107 mmol/L (ref 98–111)
GFR calc Af Amer: 16 mL/min — ABNORMAL LOW (ref >60)
GFR, EST NON AFRICAN AMERICAN: 14 mL/min — AB (ref >60)
GLUCOSE: 142 mg/dL — AB (ref 70–99)
Potassium: 4.1 mmol/L (ref 3.5–5.1)
SODIUM: 138 mmol/L (ref 135–145)

## 2018-06-20 LAB — CREATININE, SERUM
CREATININE: 3.66 mg/dL — AB (ref 0.61–1.24)
GFR calc Af Amer: 16 mL/min — ABNORMAL LOW (ref >60)
GFR calc non Af Amer: 14 mL/min — ABNORMAL LOW (ref >60)

## 2018-06-20 LAB — T4: T4, Total: 6.1 ug/dL (ref 4.5–12.0)

## 2018-06-20 LAB — T3, FREE: T3, Free: 1.9 pg/mL — ABNORMAL LOW (ref 2.0–4.4)

## 2018-06-20 MED ORDER — FERROUS SULFATE 325 (65 FE) MG PO TABS: 325.0000 mg | ORAL_TABLET | Freq: Every day | ORAL | 3 refills | Status: AC

## 2018-06-20 MED ORDER — TAMSULOSIN HCL 0.4 MG PO CAPS: 0.4000 mg | ORAL_CAPSULE | Freq: Every day | ORAL | 0 refills | Status: AC

## 2018-06-20 MED ORDER — AZITHROMYCIN 250 MG PO TABS: ORAL_TABLET | ORAL | 0 refills | Status: AC

## 2018-06-20 MED ORDER — IPRATROPIUM-ALBUTEROL 0.5-2.5 (3) MG/3ML IN SOLN: 3.0000 mL | RESPIRATORY_TRACT | 0 refills | Status: AC | PRN

## 2018-06-20 MED ORDER — CEFUROXIME AXETIL 500 MG PO TABS: 500.0000 mg | ORAL_TABLET | Freq: Two times a day (BID) | ORAL | 0 refills | Status: AC

## 2018-06-20 MED ORDER — ASPIRIN EC 325 MG PO TBEC: 325.0000 mg | DELAYED_RELEASE_TABLET | Freq: Every day | ORAL | 3 refills | Status: AC

## 2018-06-20 MED ORDER — HYDROCODONE-ACETAMINOPHEN 5-325 MG PO TABS: 1.0000 | ORAL_TABLET | Freq: Four times a day (QID) | ORAL | 0 refills | Status: AC | PRN

## 2018-06-20 MED ORDER — IPRATROPIUM-ALBUTEROL 0.5-2.5 (3) MG/3ML IN SOLN
3.0000 mL | Freq: Three times a day (TID) | RESPIRATORY_TRACT | Status: DC
  Administered 2018-06-20: 3 mL via RESPIRATORY_TRACT
  Filled 2018-06-20: qty 3

## 2018-06-20 NOTE — Clinical Social Work Note (Signed)
Patient to be d/c'ed today to [facility] room [facility].  Patient and family agreeable to plans will transport via ems RN to call report to 600 hall nurse at [phone].  CSW attempted to contacted patient's sister [name] [phone] but had to leave message on voice mail.  Windell MouldingEric Betsabe Iglesia, MSW, Theresia MajorsLCSWA [phone]

## 2018-06-20 NOTE — Progress Notes (Signed)
Physical Therapy Treatment Patient Details Name: Adrian Barber MRN: [id number] DOB: 05/19/1932 Today's Date: 06/20/2018    History of Present Illness Patient is an 84 year old male admitted from [facility] memory care s/p intramedullary nailing following a R displaced intertrochanteric hip fracture.  PMH includes Alzheimer's dementia, CKD, COPD and anemia.    PT Comments    Pt is progressing towards goals. Pt was able to preform bed mobility with total transfer, and transfer sit<>stand with Max A x2 see below for details. Pt was very confused and struggled to answer simple questions as well as assist in mobility. Current d/c recommendations remain appropriate.    Follow Up Recommendations  SNF     Equipment Recommendations  None recommended by PT    Recommendations for Other Services       Precautions / Restrictions Precautions Precautions: Fall Precaution Comments: pt in traction in supine Restrictions Weight Bearing Restrictions: Yes RLE Weight Bearing: Partial weight bearing RLE Partial Weight Bearing Percentage or Pounds: 50%    Mobility  Bed Mobility Overal bed mobility: Needs Assistance Bed Mobility: Supine to Sit;Sit to Supine     Supine to sit: Total assist Sit to supine: Total assist;+2 for physical assistance   General bed mobility comments: Pt resisted Movement via posterior lean and LE guarding movement  Transfers Overall transfer level: Needs assistance Equipment used: Rolling walker (2 wheeled) Transfers: Sit to/from Stand Sit to Stand: Max assist;+2 physical assistance         General transfer comment: Pt required Max A x2 for sit<>stand at gait belt and via blocking unaffected LE. PT had pt place affected limb on PT foot to prevent weight bearing greater than 50%. Pt had seriorus posterior lean in standing. Pt did help some, but did not respond to verbal or tactile cueing.  Ambulation/Gait             General Gait Details: Deffered  secondary to difficulty with standing   Stairs             Wheelchair Mobility    Modified Rankin (Stroke Patients Only)       Balance Overall balance assessment: Needs assistance Sitting-balance support: Bilateral upper extremity supported;Feet supported Sitting balance-Leahy Scale: Poor     Standing balance support: Bilateral upper extremity supported Standing balance-Leahy Scale: Poor Standing balance comment: heavy posterior lean                            Cognition Arousal/Alertness: Awake/alert Behavior During Therapy: WFL for tasks assessed/performed Overall Cognitive Status: History of cognitive impairments - at baseline                                 General Comments: not answering questions       Exercises      General Comments        Pertinent Vitals/Pain Pain Assessment: Faces Faces Pain Scale: Hurts little more Pain Location: R hip during bed mobility Pain Descriptors / Indicators: Operative site guarding Pain Intervention(s): Limited activity within patient's tolerance;Repositioned    Home Living                      Prior Function            PT Goals (current goals can now be found in the care plan section) Acute Rehab PT Goals PT Goal  Formulation: Patient unable to participate in goal setting Progress towards PT goals: Progressing toward goals    Frequency    BID      PT Plan Current plan remains appropriate    Co-evaluation              AM-PAC PT "6 Clicks" Daily Activity  Outcome Measure  Difficulty turning over in bed (including adjusting bedclothes, sheets and blankets)?: Unable Difficulty moving from lying on back to sitting on the side of the bed? : Unable Difficulty sitting down on and standing up from a chair with arms (e.g., wheelchair, bedside commode, etc,.)?: Unable Help needed moving to and from a bed to chair (including a wheelchair)?: Total Help needed walking in  hospital room?: Total Help needed climbing 3-5 steps with a railing? : Total 6 Click Score: 6    End of Session Equipment Utilized During Treatment: Gait belt Activity Tolerance: Patient limited by pain Patient left: in bed;with bed alarm set;with call bell/phone within reach Nurse Communication: Mobility status PT Visit Diagnosis: Muscle weakness (generalized) (M62.81);Pain;History of falling (Z91.81) Pain - Right/Left: Right Pain - part of body: Hip     Time: 1411-1430 PT Time Calculation (min) (ACUTE ONLY): 19 min  Charges:                        Renaldo HarrisonStephen Daris Harkins, SPT    Renaldo HarrisonStephen Floy Angert 06/20/2018, 5:17 PM

## 2018-06-20 NOTE — Discharge Summary (Addendum)
[facility]   PATIENT NAME: Adrian Barber    MR#:  [id number]  DATE OF BIRTH:  1932/02/21  DATE OF ADMISSION:  06/17/2018 ADMITTING PHYSICIAN: Bertrum Sol, MD  DATE OF DISCHARGE: 06/20/2018  PRIMARY CARE PHYSICIAN: Mickey Farber, MD    ADMISSION DIAGNOSIS:  Closed right hip fracture, initial encounter (HCC) [S72.001A]  DISCHARGE DIAGNOSIS:  Active Problems:   Hip fx (HCC)   SECONDARY DIAGNOSIS:   Past Medical History:  Diagnosis Date  . Alzheimer's dementia   . Anemia   . Chronic kidney disease   . COPD (chronic obstructive pulmonary disease) (HCC)   . Hypothyroidism   . Vitamin B12 deficiency   . Vitamin D deficiency     HOSPITAL COURSE:   84 year old male with history of dementia and recent diagnosis of community-acquired pneumonia who presented to ER after a fall and was complaining of right hip pain.  1.Displaced fracture within the intertrochanteric portion of the RIGHT femoral neck: Postoperative day #2 ORIF DVT prophylaxis as per orthopedic surgery with aspirin 325 mg for 6 weeks and pain control. He will follow-up with orthopedic surgery in 2 weeks   2.  Community-acquired pneumonia: He will be discharged on Ceftin and azithromycin.    3.  Hypothyroidism: TSH is elevated T3 and T4 ordered however this lab has not resulted.  We asked the PCP repeat thyroid testing in 1 week.   Continue current dose of Synthroid  4.  Dementia: Continue Namenda   5.  Acute on Chronic kidney disease stage III: Creatinine has increased due to urinary retention.  Patient's Foley was replaced.  He is started on Flomax.  He needs a voiding trial in 1 week.  He needs creatinine repeated in 2 days. Renal ultrasound shows no hydronephrosis.   6.  Complex appearing mass within the abdomen just inferior to the umbilicus. No peristalsis is observed and appears separate from the abdominal aorta. Further evaluation with  abdominal-pelvic CT or MRI is recommended as per ultrasound report.. At this time given age and comordities we did not pursue this. If indicated this can be ordered by outpatient PCP.   DISCHARGE CONDITIONS AND DIET:   Dysphasia 3 diet fair condition at discharge  CONSULTS OBTAINED:  Treatment Team:  Deeann Saint, MD  DRUG ALLERGIES:  No Known Allergies  DISCHARGE MEDICATIONS:   Allergies as of 06/20/2018   No Known Allergies     Medication List    TAKE these medications   acetaminophen 325 MG tablet Commonly known as:  TYLENOL Take 650 mg by mouth every 6 (six) hours as needed for fever.   aspirin EC 325 MG tablet Take 1 tablet (325 mg total) by mouth daily. What changed:    medication strength  how much to take   azithromycin 250 MG tablet Commonly known as:  ZITHROMAX Take 1 tablet for 3 days What changed:  additional instructions   cefUROXime 500 MG tablet Commonly known as:  CEFTIN Take 1 tablet (500 mg total) by mouth 2 (two) times daily with a meal for 3 days.   donepezil 10 MG tablet Commonly known as:  ARICEPT Take 10 mg by mouth daily.   ferrous sulfate 325 (65 FE) MG tablet Take 1 tablet (325 mg total) by mouth daily with breakfast. Start taking on:  06/21/2018   furosemide 20 MG tablet Commonly known as:  LASIX Take 20 mg by mouth daily.   HYDROcodone-acetaminophen 5-325 MG tablet Commonly known as:  NORCO/VICODIN Take 1 tablet by mouth every 6 (six) hours as needed for moderate pain (pain score 4-6).   ipratropium-albuterol 0.5-2.5 (3) MG/3ML Soln Commonly known as:  DUONEB Take 3 mLs by nebulization every 4 (four) hours as needed.   levothyroxine 50 MCG tablet Commonly known as:  SYNTHROID, LEVOTHROID Take 50 mcg by mouth daily.   memantine 10 MG tablet Commonly known as:  NAMENDA Take 10 mg by mouth 2 (two) times daily.   mirtazapine 15 MG tablet Commonly known as:  REMERON Take 15 mg by mouth at bedtime.   tamsulosin 0.4 MG  Caps capsule Commonly known as:  FLOMAX Take 1 capsule (0.4 mg total) by mouth daily after supper.   vitamin B-12 1000 MCG tablet Commonly known as:  CYANOCOBALAMIN Take 1,000 mcg by mouth daily.   Vitamin D3 2000 units capsule Take 2,000 Units by mouth daily.         Today   CHIEF COMPLAINT:  No acute events overnight.  Creatinine was noted to be increased.  Patient had over 1400 cc on bladder scan.  Foley catheter will be replaced.   VITAL SIGNS:  Blood pressure (!) 143/78, pulse 94, temperature 98.4 F (36.9 C), temperature source Oral, resp. rate 18, height 5\' 9"  (1.753 m), weight 170 lb (77.1 kg), SpO2 94 %.   REVIEW OF SYSTEMS:  Review of Systems  Unable to perform ROS: Dementia     PHYSICAL EXAMINATION:  GENERAL:  84 y.o.-year-old patient lying in the bed with no acute distress.  NECK:  Supple, no jugular venous distention. No thyroid enlargement, no tenderness.  LUNGS: Normal breath sounds bilaterally, no wheezing, rales,rhonchi  No use of accessory muscles of respiration.  CARDIOVASCULAR: S1, S2 normal. No murmurs, rubs, or gallops.  ABDOMEN: Soft, non-tender, non-distended. Bowel sounds present. No organomegaly or mass.  EXTREMITIES: No pedal edema, cyanosis, or clubbing.  PSYCHIATRIC: The patient is alert and only demented  SKIN: No obvious rash, lesion, or ulcer.   DATA REVIEW:   CBC Recent Labs  Lab 06/20/18 0346  WBC 11.4*  HGB 8.9*  HCT 26.2*  PLT 209    Chemistries  Recent Labs  Lab 06/16/18 1256  06/20/18 0346 06/20/18 0851  NA 141   < > 138  --   K 4.3   < > 4.1  --   CL 105   < > 107  --   CO2 26   < > 22  --   GLUCOSE 133*   < > 142*  --   BUN 30*   < > 43*  --   CREATININE 1.53*   < > 3.71* 3.66*  CALCIUM 8.8*   < > 7.7*  --   AST 35  --   --   --   ALT 16  --   --   --   ALKPHOS 66  --   --   --   BILITOT 0.8  --   --   --    < > = values in this interval not displayed.    Cardiac Enzymes Recent Labs  Lab  06/16/18 1256  TROPONINI <0.03    Microbiology Results  @[email or web address] @  RADIOLOGY:  US Renal  Result Date: 06/20/2018 CLINICAL DATA:  Renal failure EXAM: RENAL / URINARY TRACT ULTRASOUND COMPLETE COMPARISON:  Renal ultrasound of October 31, 2017. FINDINGS: Right Kidney: Length: 10.1 cm. The renal cortical echotexture is equal to or slightly greater than that of the adjacent liver. There is  no hydronephrosis. Left Kidney: Length: 10.3 cm. The renal cortical echotexture is similar to that of the left kidney. There is a tiny upper pole cortical cyst measuring 1.3 x 0.8 x 1.0 cm. There is no hydronephrosis. Bladder: Appears normal for degree of bladder distention. A Foley catheter is present. There is a complex appearing mass in the midline of the abdomen which does not exhibit peristalsis which measures 3.9 x 3.6 x 5.4 cm. IMPRESSION: Mildly increased renal cortical echotexture bilaterally consistent with medical renal disease. No hydronephrosis. The urinary bladder is decompressed by Foley catheter. Complex appearing mass within the abdomen just inferior to the umbilicus. No peristalsis is observed and appears separate from the abdominal aorta. Further evaluation with abdominal-pelvic CT or MRI is recommended. Electronically Signed   By: David  SwazilandJordan M.D.   On: 06/20/2018 12:04      Allergies as of 06/20/2018   No Known Allergies     Medication List    TAKE these medications   acetaminophen 325 MG tablet Commonly known as:  TYLENOL Take 650 mg by mouth every 6 (six) hours as needed for fever.   aspirin EC 325 MG tablet Take 1 tablet (325 mg total) by mouth daily. What changed:    medication strength  how much to take   azithromycin 250 MG tablet Commonly known as:  ZITHROMAX Take 1 tablet for 3 days What changed:  additional instructions   cefUROXime 500 MG tablet Commonly known as:  CEFTIN Take 1 tablet (500 mg total) by mouth 2 (two) times daily with a meal for 3 days.    donepezil 10 MG tablet Commonly known as:  ARICEPT Take 10 mg by mouth daily.   ferrous sulfate 325 (65 FE) MG tablet Take 1 tablet (325 mg total) by mouth daily with breakfast. Start taking on:  06/21/2018   furosemide 20 MG tablet Commonly known as:  LASIX Take 20 mg by mouth daily.   HYDROcodone-acetaminophen 5-325 MG tablet Commonly known as:  NORCO/VICODIN Take 1 tablet by mouth every 6 (six) hours as needed for moderate pain (pain score 4-6).   ipratropium-albuterol 0.5-2.5 (3) MG/3ML Soln Commonly known as:  DUONEB Take 3 mLs by nebulization every 4 (four) hours as needed.   levothyroxine 50 MCG tablet Commonly known as:  SYNTHROID, LEVOTHROID Take 50 mcg by mouth daily.   memantine 10 MG tablet Commonly known as:  NAMENDA Take 10 mg by mouth 2 (two) times daily.   mirtazapine 15 MG tablet Commonly known as:  REMERON Take 15 mg by mouth at bedtime.   tamsulosin 0.4 MG Caps capsule Commonly known as:  FLOMAX Take 1 capsule (0.4 mg total) by mouth daily after supper.   vitamin B-12 1000 MCG tablet Commonly known as:  CYANOCOBALAMIN Take 1,000 mcg by mouth daily.   Vitamin D3 2000 units capsule Take 2,000 Units by mouth daily.           Stable for discharge   Patient should follow up with ortho  CODE STATUS:     Code Status Orders  (From admission, onward)        Start     Ordered   06/17/18 1202  Do not attempt resuscitation (DNR)  Continuous    Question Answer Comment  In the event of cardiac or respiratory ARREST Do not call a "code blue"   In the event of cardiac or respiratory ARREST Do not perform Intubation, CPR, defibrillation or ACLS   In the event of cardiac or  respiratory ARREST Use medication by any route, position, wound care, and other measures to relive pain and suffering. May use oxygen, suction and manual treatment of airway obstruction as needed for comfort.   Comments nurse may pronounce      06/17/18 1201    Code Status  History    This patient has a current code status but no historical code status.    Advance Directive Documentation     Most Recent Value  Type of Advance Directive  Out of facility DNR (pink MOST or yellow form)  Pre-existing out of facility DNR order (yellow form or pink MOST form)  -  "MOST" Form in Place?  -      TOTAL TIME TAKING CARE OF THIS PATIENT: 38 minutes.    Note: This dictation was prepared with Dragon dictation along with smaller phrase technology. Any transcriptional errors that result from this process are unintentional.  Sharad Vaneaton M.D on 06/20/2018 at 2:17 PM  Between 7am to 6pm - Pager - [phone] After 6pm go to [email or web address] - Social research officer, government  [facility]  Office  [phone]  CC: Primary care physician; Mickey Farber, MD

## 2018-06-20 NOTE — Progress Notes (Signed)
PT Cancellation Note  Patient Details Name: Adrian Barber MRN: [id number] DOB: 11/02/1932   Cancelled Treatment:    Reason Eval/Treat Not Completed: Patient at procedure or test/unavailable(pt currently off floor).  Will attempt to see pt again later today.    Encarnacion ChuAshley Abashian PT, DPT 06/20/2018, 11:23 AM

## 2018-06-20 NOTE — Progress Notes (Signed)
Subjective: 2 Days Post-Op Procedure(s) (LRB): INTRAMEDULLARY (IM) NAIL FEMORAL (Right)    Patient reports pain as mild.  Objective: Sitting up alert and eating lunch.  Still confused as per is normal.  VITALS:   Vitals:   06/20/18 0926 06/20/18 1204  BP:    Pulse: 94   Resp: 18   Temp:  98.4 F (36.9 C)  SpO2: 94%     Neurologically intact ABD soft Neurovascular intact Sensation intact distally Intact pulses distally Dorsiflexion/Plantar flexion intact Incision: scant drainage  LABS Recent Labs    06/18/18 0341 06/19/18 0416 06/20/18 0346  HGB 10.8* 9.5* 8.9*  HCT 32.1* 28.3* 26.2*  WBC 12.4* 11.7* 11.4*  PLT 192 193 209    Recent Labs    06/18/18 0341 06/19/18 0416 06/20/18 0346 06/20/18 0851  NA 139 142 138  --   K 3.6 3.8 4.1  --   BUN 31* 30* 43*  --   CREATININE 1.58* 1.80* 3.71* 3.66*  GLUCOSE 121* 113* 142*  --     Recent Labs    06/17/18 1331  INR 1.09     Assessment/Plan: 2 Days Post-Op Procedure(s) (LRB): INTRAMEDULLARY (IM) NAIL FEMORAL (Right)   Advance diet Up with therapy D/C IV fluids Discharge to SNF   Enteric-coated ASA 325 mg twice daily for 6 weeks  Partial weightbearing right leg with walker  Return to clinic 2 weeks for exam and x-ray

## 2018-06-20 NOTE — Care Management Important Message (Signed)
Important Message  Patient Details  Name: Harvest DarkBoris R Ammar MRN: [id number] Date of Birth: 07-18-1932   Medicare Important Message Given:  Yes    Olegario MessierKathy A Yuktha Kerchner 06/20/2018, 10:32 AM

## 2018-06-20 NOTE — Evaluation (Signed)
Clinical/Bedside Swallow Evaluation Patient Details  Name: Adrian Barber MRN: [id number] Date of Birth: 1932/08/18  Today's Date: 06/20/2018 Time: SLP Start Time (ACUTE ONLY): 0802 SLP Stop Time (ACUTE ONLY): 0902 SLP Time Calculation (min) (ACUTE ONLY): 60 min  Past Medical History:  Past Medical History:  Diagnosis Date  . Alzheimer's dementia   . Anemia   . Chronic kidney disease   . COPD (chronic obstructive pulmonary disease) (HCC)   . Hypothyroidism   . Vitamin B12 deficiency   . Vitamin D deficiency    Past Surgical History:  Past Surgical History:  Procedure Laterality Date  . FEMUR IM NAIL Right 06/18/2018   Procedure: INTRAMEDULLARY (IM) NAIL FEMORAL;  Surgeon: Deeann Saint, MD;  Location: [facility] ORS;  Service: Orthopedics;  Laterality: Right;   HPI:  Pt is a 84 y.o. male with a known history including Dementia, COPD was seen in the ER yesterday and sent back to facility for community-acquired pneumonia, returns today as patient was found on floor-unwitnessed fall, complaining of right hip pain, ER work-up noted for right hip fracture, creatinine 1.6, CT head noted for sinusitis, white count 16,000, chest x-ray today was negative study, patient evaluated in the emergency room with multiple family members present, patient now being admitted for acute right hip fracture, community acquired pneumonia, acute kidney injury with chronic kidney disease stage III, DNR per family wishes. Pt initially was groggy post hip procedure w/ decreased alertness/awareness w/ po tasks per NSG report; he has improved each day per NSG. He requires Mod cues d/t baseline Cognitive decline/Dementia.    Assessment / Plan / Recommendation Clinical Impression  Pt appears to present w/ adequate oropharyngeal phase swallowing function w/ reduced risk for aspiration when following general aspiration precautions AND when being assisted during meals. Pt has a baseline of Cognitive decline, Dementia, post this  recent acute trauma of hip fx s/p fall. Pt fed self trials of thin liquids via Cup w/ no overt s/s of aspiration noted; no decline in vocal quality or respiratory status during/post trials. This was the same for trials of foods. During the oral phase, pt exhibited timely oral phase management, swallowing and oral clearing w/ trials of liquids and purees. However, w/ trials of increased textured foods(mech soft), pt required increased oral phase time for mastication b/f A-P transfer for swallowing and clearing. Pt endorsed need for increased mastication effort/time - suspect d/t missing lower dentition. Pt required feeding support d/t Cognitive status/decline. Suspect this could improve once pt is in a more structured setting and the confusion has lessened somewhat. Pt does have baseline Dementia and could benefit from reduced distractions during meals. Recommend a Dysphagia level 3(mech soft) diet w/ thin liquids; Pills in Puree - Crushed as needed. Recommend Aspiration precautions and Feeding Support at all meals.  SLP Visit Diagnosis: Dysphagia, oral phase (R13.11)(missing lower dentition)    Aspiration Risk  (reduced following general precautions)    Diet Recommendation  Dysphagia level 3(mech soft) diet w/ Thin liquids; general aspiration precautions; feeding support at meals  Medication Administration: Crushed with puree(as able vs Whole in puree)    Other  Recommendations Recommended Consults: (Dietician f/u) Oral Care Recommendations: Oral care BID;Staff/trained caregiver to provide oral care Other Recommendations: (n/a)   Follow up Recommendations None      Frequency and Duration (n/a)  (n/a)       Prognosis Prognosis for Safe Diet Advancement: Good Barriers to Reach Goals: Cognitive deficits      Swallow Study  General Date of Onset: 06/17/18 HPI: Pt is a 84 y.o. male with a known history including Dementia, COPD was seen in the ER yesterday and sent back to facility for  community-acquired pneumonia, returns today as patient was found on floor-unwitnessed fall, complaining of right hip pain, ER work-up noted for right hip fracture, creatinine 1.6, CT head noted for sinusitis, white count 16,000, chest x-ray today was negative study, patient evaluated in the emergency room with multiple family members present, patient now being admitted for acute right hip fracture, community acquired pneumonia, acute kidney injury with chronic kidney disease stage III, DNR per family wishes. Pt initially was groggy post hip procedure w/ decreased alertness/awareness w/ po tasks per NSG report; he has improved each day per NSG. He requires Mod cues d/t baseline Cognitive decline/Dementia.  Type of Study: Bedside Swallow Evaluation Previous Swallow Assessment: none reported Diet Prior to this Study: Regular;Thin liquids Temperature Spikes Noted: No(wbc 11.4) Respiratory Status: Nasal cannula(2 liters) History of Recent Intubation: No Behavior/Cognition: Cooperative;Pleasant mood;Confused;Distractible;Requires cueing(awake) Oral Cavity Assessment: Within Functional Limits Oral Care Completed by SLP: Recent completion by staff Oral Cavity - Dentition: Poor condition;Missing dentition(few upper dentition) Vision: Functional for self-feeding(held cup to drink) Self-Feeding Abilities: Able to feed self;Needs assist;Needs set up;Total assist(d/t Dementia/Cognitive decline at this time) Patient Positioning: Upright in bed Baseline Vocal Quality: Normal(mumbled speech) Volitional Cough: Cognitively unable to elicit Volitional Swallow: Unable to elicit    Oral/Motor/Sensory Function Overall Oral Motor/Sensory Function: Within functional limits   Ice Chips Ice chips: Within functional limits Presentation: Spoon(fed; 2 trials)   Thin Liquid Thin Liquid: Within functional limits Presentation: Cup;Self Fed(~4 ozs)    Nectar Thick Nectar Thick Liquid: Not tested   Honey Thick Honey Thick  Liquid: Not tested   Puree Puree: Within functional limits Presentation: Spoon(fed; ~5 ozs)   Solid     Solid: Impaired Presentation: Spoon(fed; 20+ boluses) Oral Phase Impairments: Impaired mastication(missing lower dentition; few upper teeth only) Oral Phase Functional Implications: Impaired mastication Pharyngeal Phase Impairments: (none) Other Comments: pt required feeding d/t confusion; easily distracted      Jerilynn SomKatherine Watson, MS, CCC-SLP Watson,Katherine 06/20/2018,4:18 PM

## 2018-06-20 NOTE — Progress Notes (Signed)
Pt ready for d/c to SNF today per MD. Foley inserted this morning with 1500cc urine returned. Pt had BM after enema. [facility]. EMS transportation set up for pt-4th in line. VSS. Voicemail left for pt's sister, [name], as an update.   VidetteHudson, Latricia HeftKorie G

## 2018-07-28 ENCOUNTER — Ambulatory Visit: Payer: Self-pay | Admitting: Urology

## 2018-08-07 ENCOUNTER — Other Ambulatory Visit: Payer: Self-pay

## 2018-08-07 ENCOUNTER — Encounter: Payer: Self-pay | Admitting: Urology

## 2018-08-07 ENCOUNTER — Ambulatory Visit (INDEPENDENT_AMBULATORY_CARE_PROVIDER_SITE_OTHER): Payer: Medicare Other | Admitting: Urology

## 2018-08-07 VITALS — BP 160/70 | HR 66 | Ht 68.0 in | Wt 160.0 lb

## 2018-08-07 DIAGNOSIS — R1905 Periumbilic swelling, mass or lump: Secondary | ICD-10-CM | POA: Diagnosis not present

## 2018-08-07 DIAGNOSIS — R339 Retention of urine, unspecified: Secondary | ICD-10-CM

## 2018-08-07 LAB — BLADDER SCAN AMB NON-IMAGING

## 2018-08-07 NOTE — Progress Notes (Signed)
08/07/2018 8:36 AM   SHAWNMICHAEL PARENTEAU [birth year] [id number]  Referring provider: Mickey Farber, MD [address] [facility] [place], [place] [address]  Chief Complaint  Patient presents with  . Urinary Retention    HPI: 84 year old male with Alzheimer's dementia who was admitted on 06/17/2018 with a closed right hip fracture who underwent ORIF.  He was noted to be in urinary retention during that hospitalization and a Foley catheter was placed.  He was started on tamsulosin and transferred to a skilled nursing facility.  A renal ultrasound was performed during that hospitalization.  Creatinine was 3.71.  There was no hydronephrosis.  Creatinine was 3.66 on discharge.  He was noted to have a complex cystic mass just below the umbilicus on renal ultrasound.  He did have a renal ultrasound performed in December 2018 for chronic kidney disease and this did not show the cystic mass.  He presents today with his friend.  She thinks his catheter was removed approximately 2 weeks ago.  He states he is having no voiding problems.  She does note he has had urinary frequency.  He has had no discomfort.  Denies dysuria or gross hematuria.  Denies previous history of urinary retention or prior urologic problems.  PMH: Past Medical History:  Diagnosis Date  . Alzheimer's dementia   . Anemia   . Chronic kidney disease   . COPD (chronic obstructive pulmonary disease) (HCC)   . Hypothyroidism   . Vitamin B12 deficiency   . Vitamin D deficiency     Surgical History: Past Surgical History:  Procedure Laterality Date  . FEMUR IM NAIL Right 06/18/2018   Procedure: INTRAMEDULLARY (IM) NAIL FEMORAL;  Surgeon: Deeann Saint, MD;  Location: [facility] ORS;  Service: Orthopedics;  Laterality: Right;    Home Medications:  Allergies as of 08/07/2018   No Known Allergies     Medication List        Accurate as of 08/07/18  8:36 AM. Always use your most recent med list.          acetaminophen  325 MG tablet Commonly known as:  TYLENOL Take 650 mg by mouth every 6 (six) hours as needed for fever.   aspirin EC 325 MG tablet Take 1 tablet (325 mg total) by mouth daily.   donepezil 10 MG tablet Commonly known as:  ARICEPT Take 10 mg by mouth daily.   ferrous sulfate 325 (65 FE) MG tablet Take 1 tablet (325 mg total) by mouth daily with breakfast.   furosemide 20 MG tablet Commonly known as:  LASIX Take 20 mg by mouth daily.   HYDROcodone-acetaminophen 5-325 MG tablet Commonly known as:  NORCO/VICODIN Take 1 tablet by mouth every 6 (six) hours as needed for moderate pain (pain score 4-6).   ipratropium-albuterol 0.5-2.5 (3) MG/3ML Soln Commonly known as:  DUONEB Take 3 mLs by nebulization every 4 (four) hours as needed.   levothyroxine 50 MCG tablet Commonly known as:  SYNTHROID, LEVOTHROID Take 50 mcg by mouth daily.   memantine 10 MG tablet Commonly known as:  NAMENDA Take 10 mg by mouth 2 (two) times daily.   mirtazapine 15 MG tablet Commonly known as:  REMERON Take 15 mg by mouth at bedtime.   tamsulosin 0.4 MG Caps capsule Commonly known as:  FLOMAX Take 1 capsule (0.4 mg total) by mouth daily after supper.   vitamin B-12 1000 MCG tablet Commonly known as:  CYANOCOBALAMIN Take 1,000 mcg by mouth daily.   Vitamin D3  2000 units capsule Take 2,000 Units by mouth daily.       Allergies: No Known Allergies  Family History: History reviewed. No pertinent family history.  Social History:  reports that he has quit smoking. He has quit using smokeless tobacco.  His smokeless tobacco use included chew. He reports that he drank alcohol. His drug history is not on file.  ROS: UROLOGY Frequent Urination?: No Hard to postpone urination?: No Burning/pain with urination?: No Get up at night to urinate?: No Leakage of urine?: No Urine stream starts and stops?: No Trouble starting stream?: No Do you have to strain to urinate?: No Blood in urine?:  No Urinary tract infection?: No Sexually transmitted disease?: No Injury to kidneys or bladder?: No Painful intercourse?: No Weak stream?: No Erection problems?: No Penile pain?: No  Gastrointestinal Nausea?: No Vomiting?: No Indigestion/heartburn?: No Diarrhea?: No Constipation?: No  Constitutional Fever: No Night sweats?: No Weight loss?: No Fatigue?: No  Skin Skin rash/lesions?: No Itching?: No  Eyes Blurred vision?: No Double vision?: No  Ears/Nose/Throat Sore throat?: No Sinus problems?: No  Hematologic/Lymphatic Swollen glands?: No Easy bruising?: No  Cardiovascular Leg swelling?: No Chest pain?: No  Respiratory Cough?: No Shortness of breath?: No  Endocrine Excessive thirst?: No  Musculoskeletal Back pain?: No Joint pain?: No  Neurological Headaches?: No Dizziness?: No  Psychologic Depression?: No Anxiety?: No  Physical Exam: BP (!) 160/70   Pulse 66   Ht 5\' 8"  (1.727 m)   Wt 160 lb (72.6 kg)   BMI 24.33 kg/m   Constitutional:  Alert and oriented, No acute distress. HEENT: [place] AT, moist mucus membranes.  Trachea midline, no masses. Cardiovascular: No clubbing, cyanosis, or edema. Respiratory: Normal respiratory effort, no increased work of breathing. GI: Abdomen is soft, nontender, nondistended, no abdominal masses GU: No CVA tenderness Lymph: No cervical or inguinal lymphadenopathy. Skin: No rashes, bruises or suspicious lesions. Neurologic: Grossly intact, no focal deficits, moving all 4 extremities. Psychiatric: Normal mood and affect.   Pertinent Imaging:  Results for orders placed during the hospital encounter of 06/17/18  US RENAL   Narrative CLINICAL DATA:  Renal failure  EXAM: RENAL / URINARY TRACT ULTRASOUND COMPLETE  COMPARISON:  Renal ultrasound of October 31, 2017.  FINDINGS: Right Kidney:  Length: 10.1 cm. The renal cortical echotexture is equal to or slightly greater than that of the adjacent liver.  There is no hydronephrosis.  Left Kidney:  Length: 10.3 cm. The renal cortical echotexture is similar to that of the left kidney. There is a tiny upper pole cortical cyst measuring 1.3 x 0.8 x 1.0 cm. There is no hydronephrosis.  Bladder:  Appears normal for degree of bladder distention. A Foley catheter is present.  There is a complex appearing mass in the midline of the abdomen which does not exhibit peristalsis which measures 3.9 x 3.6 x 5.4 cm.  IMPRESSION: Mildly increased renal cortical echotexture bilaterally consistent with medical renal disease. No hydronephrosis. The urinary bladder is decompressed by Foley catheter.  Complex appearing mass within the abdomen just inferior to the umbilicus. No peristalsis is observed and appears separate from the abdominal aorta. Further evaluation with abdominal-pelvic CT or MRI is recommended.   Electronically Signed   By: David  [place] M.D.   On: 06/20/2018 12:04    Assessment & Plan:   84 year old male with urinary retention.  He is presently asymptomatic.  A bladder scan for postvoid bladder volume today was > 398 mL.  This may not be reliable  if he has a cystic infraumbilical mass.  Based on his renal function will schedule a CT of the abdomen/pelvis for further evaluation.  Riki AltesScott C Bernell Haynie, MD  [facility] [address], [address] [place], [place] [address] [phone]

## 2019-01-14 DEATH — deceased

## 2019-11-04 ENCOUNTER — Other Ambulatory Visit: Payer: Self-pay | Admitting: Urology

## 2019-11-04 DIAGNOSIS — R1905 Periumbilic swelling, mass or lump: Secondary | ICD-10-CM

## 2020-02-14 IMAGING — XA DG HIP (WITH PELVIS) OPERATIVE*R*
5 series · 5 of 5 positions shown · non-contrast
Comparison: 06/17/2018

CLINICAL DATA: Right IM nail

EXAM:
OPERATIVE RIGHT HIP (WITH PELVIS IF PERFORMED)  VIEWS
TECHNIQUE: Fluoroscopic spot image(s) were submitted for interpretation
post-operatively.

[Series 1: cont. · 1 of 1 slices shown (1 of 5)]
[im 1/1]
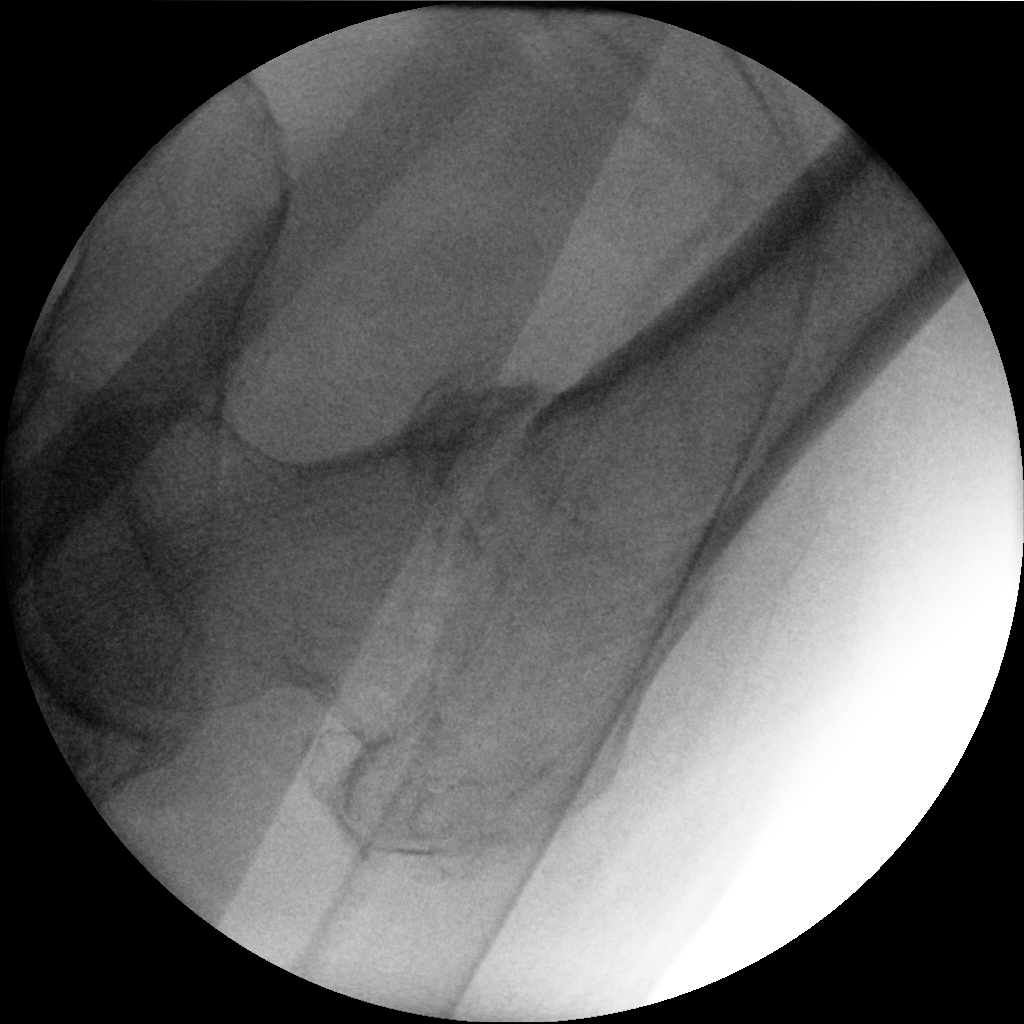

[Series 2: cont. · 1 of 1 slices shown (2 of 5)]
[im 1/1]
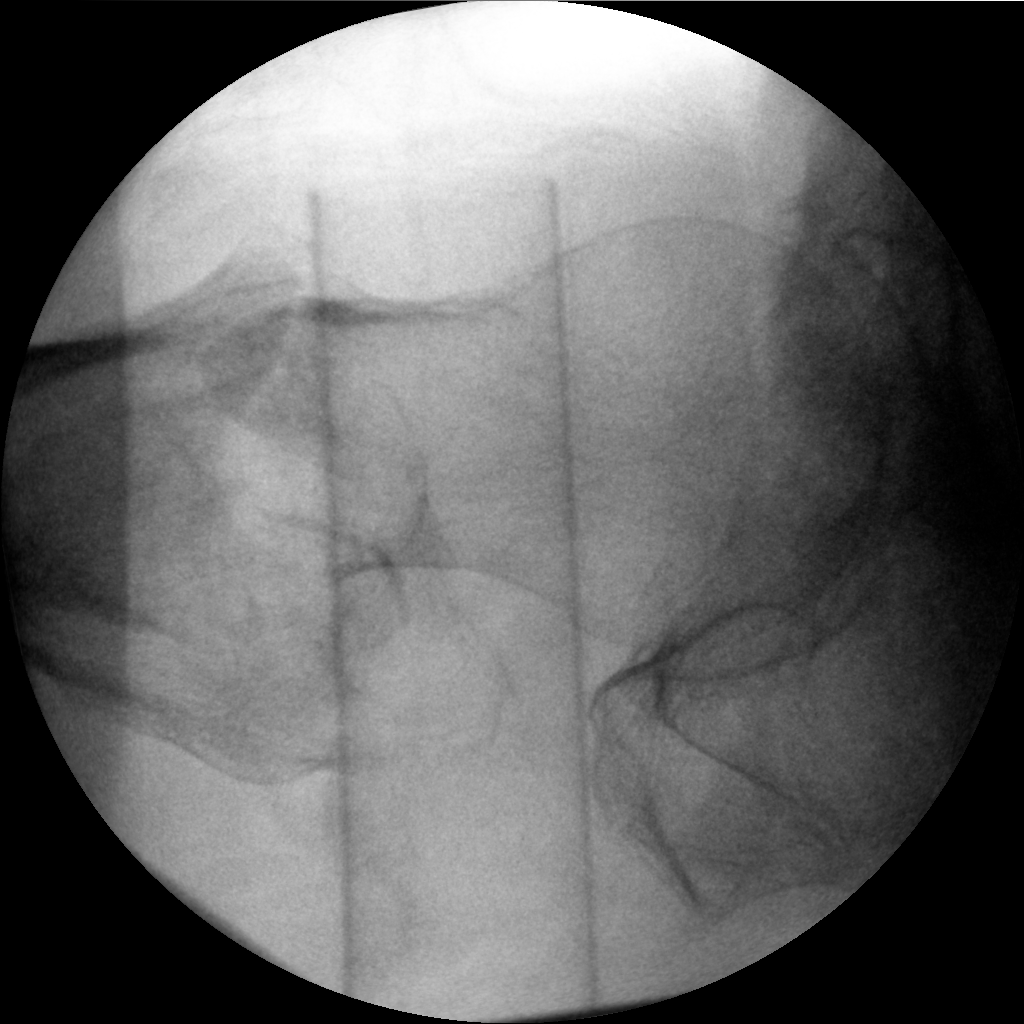

[Series 9: cont. · 1 of 1 slices shown (3 of 5)]
[im 1/1]
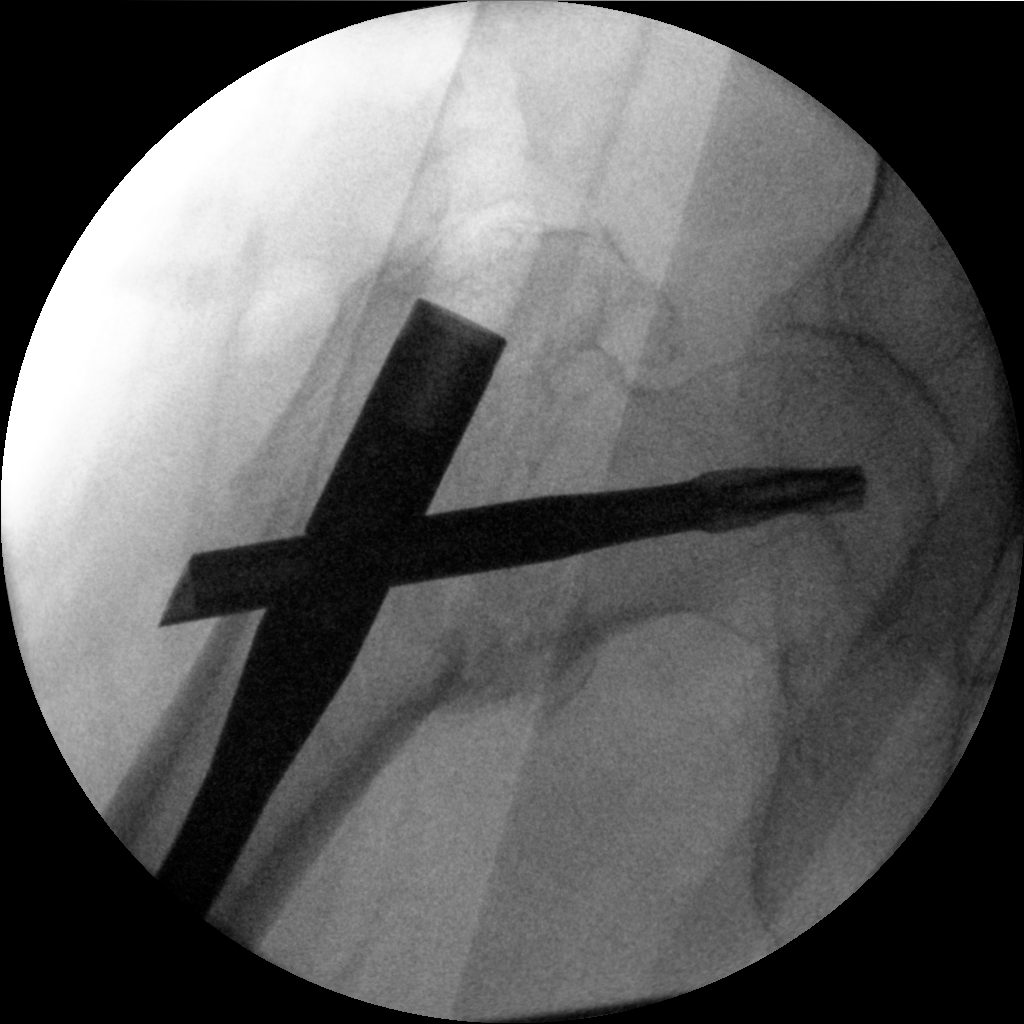

[Series 10: cont. · 1 of 1 slices shown (4 of 5)]
[im 1/1]
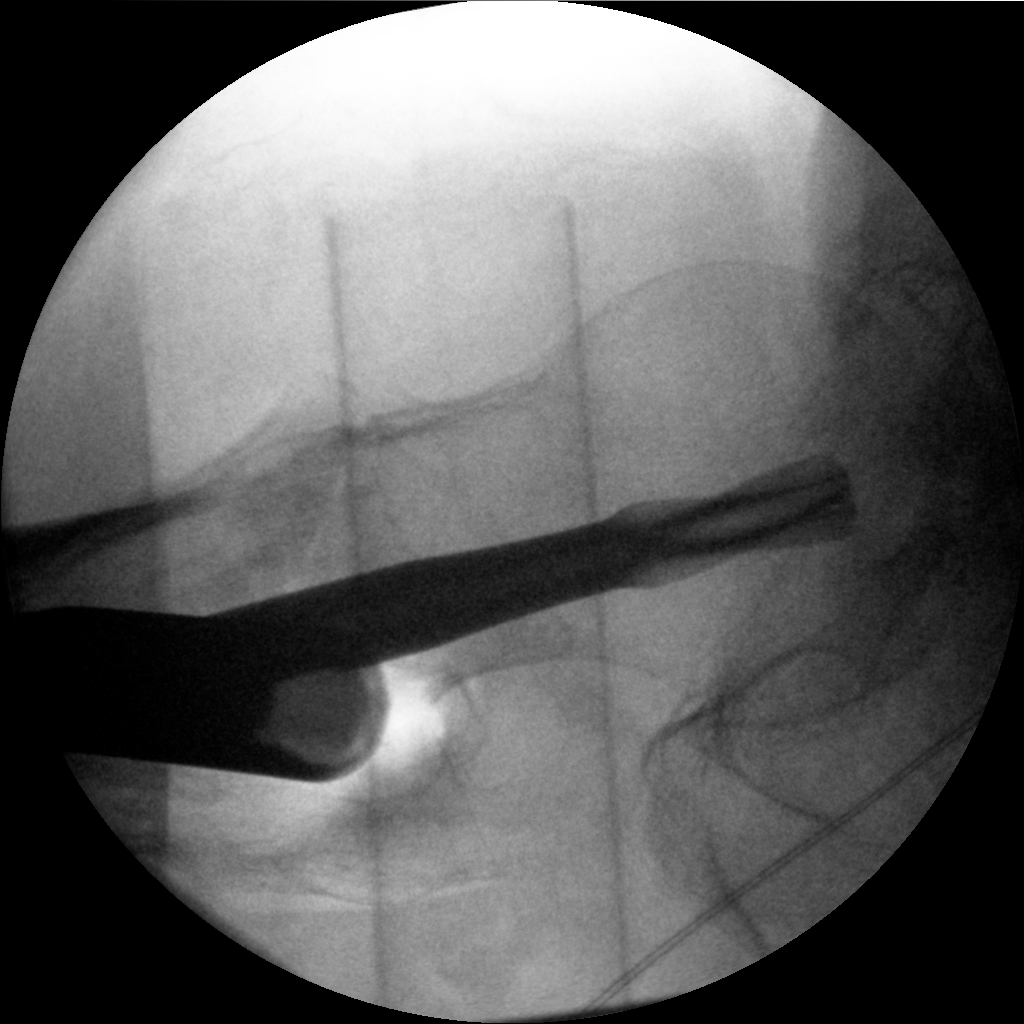

[Series 11: cont. · 1 of 1 slices shown (5 of 5)]
[im 1/1]
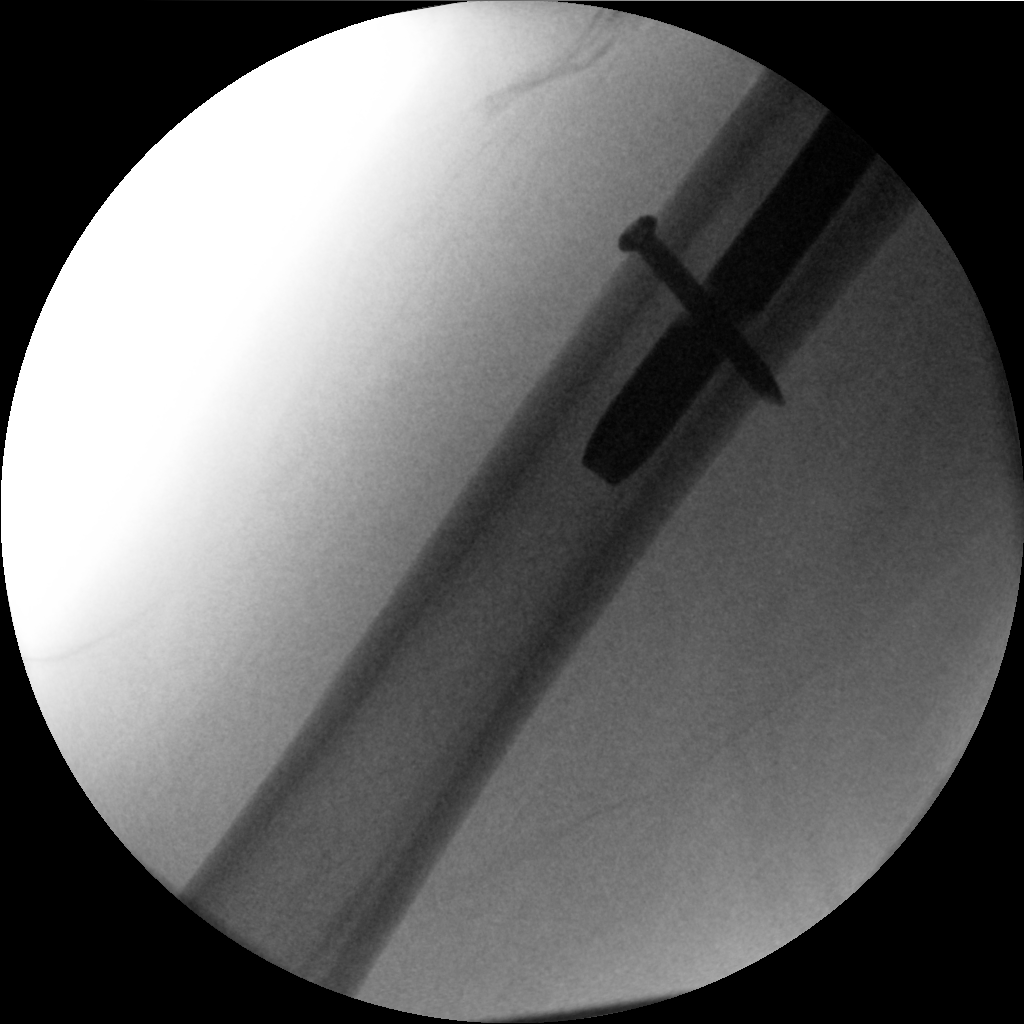

[5 of 5 positions shown; findings below may reference images not displayed]

FINDINGS: Five images are performed, showing preoperative and postoperative
appearance of the RIGHT hip. Patient has undergone intramedullary
nail and lag screw fixation of intertrochanteric fracture. No
dislocation or interval fracture detected.
IMPRESSION: ORIF of RIGHT intertrochanteric fracture.
